# Patient Record
Sex: Female | Born: 1950 | Hispanic: Yes | Marital: Single
Health system: Southern US, Community
[De-identification: ages and names within clinical notes are randomized; demographics above are authoritative.]

## PROBLEM LIST (undated history)

## (undated) DIAGNOSIS — J910 Malignant pleural effusion: Secondary | ICD-10-CM

## (undated) DIAGNOSIS — C259 Malignant neoplasm of pancreas, unspecified: Secondary | ICD-10-CM

## (undated) DIAGNOSIS — I5020 Unspecified systolic (congestive) heart failure: Secondary | ICD-10-CM

## (undated) DIAGNOSIS — J9611 Chronic respiratory failure with hypoxia: Secondary | ICD-10-CM

## (undated) DIAGNOSIS — E43 Unspecified severe protein-calorie malnutrition: Secondary | ICD-10-CM

## (undated) DIAGNOSIS — I513 Intracardiac thrombosis, not elsewhere classified: Secondary | ICD-10-CM

## (undated) DIAGNOSIS — C801 Malignant (primary) neoplasm, unspecified: Secondary | ICD-10-CM

---

## 2021-06-26 ENCOUNTER — Emergency Department: Payer: Medicare Other

## 2021-06-26 ENCOUNTER — Inpatient Hospital Stay: Payer: Medicare Other

## 2021-06-26 ENCOUNTER — Inpatient Hospital Stay
Admission: EM | Admit: 2021-06-26 | Discharge: 2021-06-30 | DRG: 871 | Disposition: A | Discharge status: Other Acute Inpatient Hospital | Payer: Medicare Other | Attending: Internal Medicine | Admitting: Internal Medicine

## 2021-06-26 ENCOUNTER — Encounter
Admission: EM | Disposition: A | Discharge status: Other Acute Inpatient Hospital | Payer: Self-pay | Source: Home / Self Care | Attending: Internal Medicine

## 2021-06-26 ENCOUNTER — Inpatient Hospital Stay: Payer: Medicare Other | Admitting: Anesthesiology

## 2021-06-26 ENCOUNTER — Other Ambulatory Visit: Payer: Self-pay

## 2021-06-26 DIAGNOSIS — A419 Sepsis, unspecified organism: Secondary | ICD-10-CM

## 2021-06-26 DIAGNOSIS — D75838 Other thrombocytosis: Secondary | ICD-10-CM | POA: Diagnosis present

## 2021-06-26 DIAGNOSIS — Z20822 Contact with and (suspected) exposure to covid-19: Secondary | ICD-10-CM | POA: Diagnosis present

## 2021-06-26 DIAGNOSIS — E871 Hypo-osmolality and hyponatremia: Secondary | ICD-10-CM | POA: Diagnosis present

## 2021-06-26 DIAGNOSIS — C259 Malignant neoplasm of pancreas, unspecified: Secondary | ICD-10-CM | POA: Diagnosis present

## 2021-06-26 DIAGNOSIS — C249 Malignant neoplasm of biliary tract, unspecified: Secondary | ICD-10-CM | POA: Diagnosis present

## 2021-06-26 DIAGNOSIS — K819 Cholecystitis, unspecified: Secondary | ICD-10-CM

## 2021-06-26 DIAGNOSIS — Z79899 Other long term (current) drug therapy: Secondary | ICD-10-CM

## 2021-06-26 DIAGNOSIS — E8809 Other disorders of plasma-protein metabolism, not elsewhere classified: Secondary | ICD-10-CM | POA: Diagnosis present

## 2021-06-26 DIAGNOSIS — R4182 Altered mental status, unspecified: Secondary | ICD-10-CM | POA: Diagnosis present

## 2021-06-26 DIAGNOSIS — K8309 Other cholangitis: Secondary | ICD-10-CM | POA: Diagnosis present

## 2021-06-26 DIAGNOSIS — M6281 Muscle weakness (generalized): Secondary | ICD-10-CM | POA: Diagnosis present

## 2021-06-26 DIAGNOSIS — R1011 Right upper quadrant pain: Secondary | ICD-10-CM

## 2021-06-26 DIAGNOSIS — K859 Acute pancreatitis without necrosis or infection, unspecified: Secondary | ICD-10-CM

## 2021-06-26 DIAGNOSIS — I10 Essential (primary) hypertension: Secondary | ICD-10-CM | POA: Diagnosis present

## 2021-06-26 DIAGNOSIS — F32A Depression, unspecified: Secondary | ICD-10-CM | POA: Diagnosis present

## 2021-06-26 DIAGNOSIS — D72829 Elevated white blood cell count, unspecified: Secondary | ICD-10-CM | POA: Diagnosis not present

## 2021-06-26 DIAGNOSIS — E876 Hypokalemia: Secondary | ICD-10-CM | POA: Diagnosis not present

## 2021-06-26 DIAGNOSIS — E878 Other disorders of electrolyte and fluid balance, not elsewhere classified: Secondary | ICD-10-CM | POA: Diagnosis present

## 2021-06-26 DIAGNOSIS — F419 Anxiety disorder, unspecified: Secondary | ICD-10-CM | POA: Diagnosis present

## 2021-06-26 DIAGNOSIS — Z6841 Body Mass Index (BMI) 40.0 and over, adult: Secondary | ICD-10-CM | POA: Diagnosis not present

## 2021-06-26 DIAGNOSIS — K8689 Other specified diseases of pancreas: Secondary | ICD-10-CM

## 2021-06-26 DIAGNOSIS — K82A1 Gangrene of gallbladder in cholecystitis: Secondary | ICD-10-CM | POA: Diagnosis present

## 2021-06-26 DIAGNOSIS — R7989 Other specified abnormal findings of blood chemistry: Secondary | ICD-10-CM | POA: Diagnosis not present

## 2021-06-26 DIAGNOSIS — K810 Acute cholecystitis: Secondary | ICD-10-CM | POA: Diagnosis not present

## 2021-06-26 DIAGNOSIS — D6489 Other specified anemias: Secondary | ICD-10-CM | POA: Diagnosis present

## 2021-06-26 DIAGNOSIS — A4151 Sepsis due to Escherichia coli [E. coli]: Secondary | ICD-10-CM | POA: Diagnosis present

## 2021-06-26 DIAGNOSIS — Z515 Encounter for palliative care: Secondary | ICD-10-CM | POA: Diagnosis not present

## 2021-06-26 DIAGNOSIS — R6520 Severe sepsis without septic shock: Secondary | ICD-10-CM | POA: Diagnosis present

## 2021-06-26 DIAGNOSIS — K831 Obstruction of bile duct: Secondary | ICD-10-CM | POA: Diagnosis present

## 2021-06-26 DIAGNOSIS — R17 Unspecified jaundice: Secondary | ICD-10-CM

## 2021-06-26 DIAGNOSIS — Z7189 Other specified counseling: Secondary | ICD-10-CM | POA: Diagnosis not present

## 2021-06-26 HISTORY — PX: ERCP: SHX5425

## 2021-06-26 LAB — BASIC METABOLIC PANEL
Anion gap: 7 (ref 5–15)
BUN: 7 mg/dL — ABNORMAL LOW (ref 8–23)
CO2: 24 mmol/L (ref 22–32)
Calcium: 7.7 mg/dL — ABNORMAL LOW (ref 8.9–10.3)
Chloride: 99 mmol/L (ref 98–111)
Creatinine, Ser: <0.3 mg/dL — ABNORMAL LOW (ref 0.44–1.00)
Glucose, Bld: 129 mg/dL — ABNORMAL HIGH (ref 70–99)
Potassium: 3.3 mmol/L — ABNORMAL LOW (ref 3.5–5.1)
Sodium: 130 mmol/L — ABNORMAL LOW (ref 135–145)

## 2021-06-26 LAB — SALICYLATE LEVEL: Salicylate Lvl: 8.4 mg/dL (ref 7.0–30.0)

## 2021-06-26 LAB — COMPREHENSIVE METABOLIC PANEL
ALT: 47 U/L — ABNORMAL HIGH (ref 0–44)
AST: 105 U/L — ABNORMAL HIGH (ref 15–41)
Albumin: 2 g/dL — ABNORMAL LOW (ref 3.5–5.0)
Alkaline Phosphatase: 521 U/L — ABNORMAL HIGH (ref 38–126)
Anion gap: 11 (ref 5–15)
BUN: 10 mg/dL (ref 8–23)
CO2: 27 mmol/L (ref 22–32)
Calcium: 8.5 mg/dL — ABNORMAL LOW (ref 8.9–10.3)
Chloride: 91 mmol/L — ABNORMAL LOW (ref 98–111)
Creatinine, Ser: <0.3 mg/dL — ABNORMAL LOW (ref 0.44–1.00)
Glucose, Bld: 117 mg/dL — ABNORMAL HIGH (ref 70–99)
Potassium: 2.7 mmol/L — CL (ref 3.5–5.1)
Sodium: 129 mmol/L — ABNORMAL LOW (ref 135–145)
Total Bilirubin: 20.9 mg/dL (ref 0.3–1.2)
Total Protein: 7.4 g/dL (ref 6.5–8.1)

## 2021-06-26 LAB — BLOOD CULTURE ID PANEL (REFLEXED) - BCID2

## 2021-06-26 LAB — CBC WITH DIFFERENTIAL/PLATELET
Abs Immature Granulocytes: 0.28 10*3/uL — ABNORMAL HIGH (ref 0.00–0.07)
Basophils Absolute: 0.1 10*3/uL (ref 0.0–0.1)
Basophils Relative: 0 %
Eosinophils Absolute: 0 10*3/uL (ref 0.0–0.5)
Eosinophils Relative: 0 %
HCT: 34.4 % — ABNORMAL LOW (ref 36.0–46.0)
Hemoglobin: 11.5 g/dL — ABNORMAL LOW (ref 12.0–15.0)
Immature Granulocytes: 2 %
Lymphocytes Relative: 5 %
Lymphs Abs: 0.6 10*3/uL — ABNORMAL LOW (ref 0.7–4.0)
MCH: 30.7 pg (ref 26.0–34.0)
MCHC: 33.4 g/dL (ref 30.0–36.0)
MCV: 92 fL (ref 80.0–100.0)
Monocytes Absolute: 1 10*3/uL (ref 0.1–1.0)
Monocytes Relative: 7 %
Neutro Abs: 12 10*3/uL — ABNORMAL HIGH (ref 1.7–7.7)
Neutrophils Relative %: 86 %
Platelets: 439 10*3/uL — ABNORMAL HIGH (ref 150–400)
RBC: 3.74 MIL/uL — ABNORMAL LOW (ref 3.87–5.11)
RDW: 15.9 % — ABNORMAL HIGH (ref 11.5–15.5)
WBC: 14 10*3/uL — ABNORMAL HIGH (ref 4.0–10.5)
nRBC: 0 % (ref 0.0–0.2)

## 2021-06-26 LAB — RESP PANEL BY RT-PCR (FLU A&B, COVID) ARPGX2
Influenza A by PCR: NEGATIVE
Influenza B by PCR: NEGATIVE
SARS Coronavirus 2 by RT PCR: NEGATIVE

## 2021-06-26 LAB — BLOOD GAS, VENOUS
Acid-Base Excess: 7.2 mmol/L — ABNORMAL HIGH (ref 0.0–2.0)
Bicarbonate: 31.2 mmol/L — ABNORMAL HIGH (ref 20.0–28.0)
O2 Saturation: 85.5 %
Patient temperature: 37
pCO2, Ven: 41 mmHg — ABNORMAL LOW (ref 44.0–60.0)
pH, Ven: 7.49 — ABNORMAL HIGH (ref 7.250–7.430)
pO2, Ven: 46 mmHg — ABNORMAL HIGH (ref 32.0–45.0)

## 2021-06-26 LAB — ETHANOL: Alcohol, Ethyl (B): <10 mg/dL (ref <10)

## 2021-06-26 LAB — PROTIME-INR
INR: 1.3 — ABNORMAL HIGH (ref 0.8–1.2)
Prothrombin Time: 16 seconds — ABNORMAL HIGH (ref 11.4–15.2)

## 2021-06-26 LAB — ACETAMINOPHEN LEVEL: Acetaminophen (Tylenol), Serum: <10 ug/mL — ABNORMAL LOW (ref 10–30)

## 2021-06-26 LAB — APTT: aPTT: 28 seconds (ref 24–36)

## 2021-06-26 LAB — MAGNESIUM: Magnesium: 1.9 mg/dL (ref 1.7–2.4)

## 2021-06-26 LAB — LACTIC ACID, PLASMA
Lactic Acid, Venous: 1.7 mmol/L (ref 0.5–1.9)
Lactic Acid, Venous: 1.2 mmol/L (ref 0.5–1.9)
Lactic Acid, Venous: 1.1 mmol/L (ref 0.5–1.9)

## 2021-06-26 LAB — AMMONIA: Ammonia: 47 umol/L — ABNORMAL HIGH (ref 9–35)

## 2021-06-26 SURGERY — ERCP, WITH INTERVENTION IF INDICATED
Anesthesia: General

## 2021-06-26 MED ORDER — VANCOMYCIN HCL IN DEXTROSE 1-5 GM/200ML-% IV SOLN
1000.0000 mg | INTRAVENOUS | Status: DC
  Filled 2021-06-26: qty 200

## 2021-06-26 MED ORDER — ACETAMINOPHEN 325 MG PO TABS: 650.0000 mg | ORAL_TABLET | Freq: Four times a day (QID) | ORAL | Status: DC | PRN

## 2021-06-26 MED ORDER — ROCURONIUM BROMIDE 100 MG/10ML IV SOLN
INTRAVENOUS | Status: DC | PRN
  Administered 2021-06-26: 50 mg via INTRAVENOUS
  Administered 2021-06-26: 5 mg via INTRAVENOUS

## 2021-06-26 MED ORDER — SODIUM CHLORIDE 0.9 % IV BOLUS
1000.0000 mL | Freq: Once | INTRAVENOUS | Status: AC
  Administered 2021-06-26: 1000 mL via INTRAVENOUS

## 2021-06-26 MED ORDER — SODIUM CHLORIDE 0.9 % IV BOLUS (SEPSIS)
1000.0000 mL | Freq: Once | INTRAVENOUS | Status: AC
  Administered 2021-06-26: 1000 mL via INTRAVENOUS

## 2021-06-26 MED ORDER — SODIUM CHLORIDE 0.9 % IV SOLN: 2.0000 g | Freq: Once | INTRAVENOUS | Status: DC

## 2021-06-26 MED ORDER — PROPOFOL 10 MG/ML IV BOLUS
INTRAVENOUS | Status: DC | PRN
  Administered 2021-06-26: 100 mg via INTRAVENOUS

## 2021-06-26 MED ORDER — ONDANSETRON HCL 4 MG/2ML IJ SOLN
INTRAMUSCULAR | Status: AC
  Administered 2021-06-26: 4 mg via INTRAVENOUS
  Filled 2021-06-26: qty 2

## 2021-06-26 MED ORDER — SODIUM CHLORIDE 0.9 % IV SOLN
INTRAVENOUS | Status: DC | PRN
  Administered 2021-06-26: 80 mL

## 2021-06-26 MED ORDER — INDOMETHACIN 50 MG RE SUPP
100.0000 mg | Freq: Once | RECTAL | Status: AC
  Administered 2021-06-26: 100 mg via RECTAL

## 2021-06-26 MED ORDER — PIPERACILLIN-TAZOBACTAM 3.375 G IVPB 30 MIN
3.3750 g | Freq: Once | INTRAVENOUS | Status: AC
  Administered 2021-06-26: 3.375 g via INTRAVENOUS
  Filled 2021-06-26: qty 50

## 2021-06-26 MED ORDER — SODIUM CHLORIDE 0.9 % IV SOLN: INTRAVENOUS | Status: DC

## 2021-06-26 MED ORDER — LACTATED RINGERS IV SOLN: INTRAVENOUS | Status: DC

## 2021-06-26 MED ORDER — EPINEPHRINE 1 MG/10ML IJ SOSY
PREFILLED_SYRINGE | INTRAMUSCULAR | Status: DC | PRN
  Administered 2021-06-26: .08 mg

## 2021-06-26 MED ORDER — ONDANSETRON HCL 4 MG/2ML IJ SOLN: 4.0000 mg | Freq: Once | INTRAMUSCULAR | Status: AC

## 2021-06-26 MED ORDER — METRONIDAZOLE 500 MG/100ML IV SOLN
500.0000 mg | Freq: Two times a day (BID) | INTRAVENOUS | Status: DC
  Administered 2021-06-26 – 2021-06-27 (×2): 500 mg via INTRAVENOUS
  Filled 2021-06-26 (×3): qty 100

## 2021-06-26 MED ORDER — POTASSIUM CHLORIDE CRYS ER 20 MEQ PO TBCR
40.0000 meq | EXTENDED_RELEASE_TABLET | Freq: Once | ORAL | Status: AC
  Administered 2021-06-26: 40 meq via ORAL
  Filled 2021-06-26: qty 2

## 2021-06-26 MED ORDER — MAGNESIUM HYDROXIDE 400 MG/5ML PO SUSP: 30.0000 mL | Freq: Every day | ORAL | Status: DC | PRN

## 2021-06-26 MED ORDER — POTASSIUM CHLORIDE 10 MEQ/100ML IV SOLN
10.0000 meq | INTRAVENOUS | Status: AC
  Administered 2021-06-26 (×3): 10 meq via INTRAVENOUS
  Filled 2021-06-26 (×3): qty 100

## 2021-06-26 MED ORDER — IOHEXOL 300 MG/ML  SOLN
100.0000 mL | Freq: Once | INTRAMUSCULAR | Status: AC | PRN
  Administered 2021-06-26: 100 mL via INTRAVENOUS

## 2021-06-26 MED ORDER — SUCCINYLCHOLINE CHLORIDE 200 MG/10ML IV SOSY
PREFILLED_SYRINGE | INTRAVENOUS | Status: DC | PRN
  Administered 2021-06-26: 100 mg via INTRAVENOUS

## 2021-06-26 MED ORDER — ENOXAPARIN SODIUM 40 MG/0.4ML IJ SOSY: 40.0000 mg | PREFILLED_SYRINGE | INTRAMUSCULAR | Status: DC

## 2021-06-26 MED ORDER — ACETAMINOPHEN 650 MG RE SUPP: 650.0000 mg | Freq: Four times a day (QID) | RECTAL | Status: DC | PRN

## 2021-06-26 MED ORDER — SUGAMMADEX SODIUM 200 MG/2ML IV SOLN
INTRAVENOUS | Status: DC | PRN
  Administered 2021-06-26: 200 mg via INTRAVENOUS

## 2021-06-26 MED ORDER — LACTATED RINGERS IV SOLN
INTRAVENOUS | Status: DC
  Administered 2021-06-26: 1000 mL via INTRAVENOUS

## 2021-06-26 MED ORDER — DEXMEDETOMIDINE HCL IN NACL 400 MCG/100ML IV SOLN
INTRAVENOUS | Status: DC | PRN
  Administered 2021-06-26: 8 ug via INTRAVENOUS
  Administered 2021-06-26: 4 ug via INTRAVENOUS

## 2021-06-26 MED ORDER — TRAZODONE HCL 50 MG PO TABS: 25.0000 mg | ORAL_TABLET | Freq: Every evening | ORAL | Status: DC | PRN

## 2021-06-26 MED ORDER — ONDANSETRON HCL 4 MG/2ML IJ SOLN: 4.0000 mg | Freq: Four times a day (QID) | INTRAMUSCULAR | Status: DC | PRN

## 2021-06-26 MED ORDER — LIDOCAINE HCL (CARDIAC) PF 100 MG/5ML IV SOSY
PREFILLED_SYRINGE | INTRAVENOUS | Status: DC | PRN
  Administered 2021-06-26: 100 mg via INTRAVENOUS

## 2021-06-26 MED ORDER — INDOMETHACIN 50 MG RE SUPP
RECTAL | Status: DC | PRN
  Administered 2021-06-26: 100 mg via RECTAL

## 2021-06-26 MED ORDER — VANCOMYCIN HCL 1500 MG/300ML IV SOLN
1500.0000 mg | Freq: Once | INTRAVENOUS | Status: AC
  Administered 2021-06-26: 1500 mg via INTRAVENOUS
  Filled 2021-06-26: qty 300

## 2021-06-26 MED ORDER — VANCOMYCIN HCL IN DEXTROSE 1-5 GM/200ML-% IV SOLN: 1000.0000 mg | Freq: Once | INTRAVENOUS | Status: DC

## 2021-06-26 MED ORDER — SODIUM CHLORIDE 0.9 % IV SOLN
2.0000 g | Freq: Three times a day (TID) | INTRAVENOUS | Status: DC
  Administered 2021-06-27 (×2): 2 g via INTRAVENOUS
  Filled 2021-06-26 (×4): qty 2

## 2021-06-26 MED ORDER — ONDANSETRON HCL 4 MG PO TABS: 4.0000 mg | ORAL_TABLET | Freq: Four times a day (QID) | ORAL | Status: DC | PRN

## 2021-06-26 MED ORDER — POTASSIUM CHLORIDE IN NACL 40-0.9 MEQ/L-% IV SOLN
INTRAVENOUS | Status: DC
  Filled 2021-06-26 (×8): qty 1000

## 2021-06-26 MED ORDER — ONDANSETRON HCL 4 MG/2ML IJ SOLN
INTRAMUSCULAR | Status: DC | PRN
  Administered 2021-06-26: 4 mg via INTRAVENOUS

## 2021-06-26 MED ORDER — MIDODRINE HCL 5 MG PO TABS
10.0000 mg | ORAL_TABLET | Freq: Three times a day (TID) | ORAL | Status: DC
  Filled 2021-06-26 (×3): qty 2

## 2021-06-26 MED ORDER — VANCOMYCIN HCL IN DEXTROSE 1-5 GM/200ML-% IV SOLN
1000.0000 mg | Freq: Once | INTRAVENOUS | Status: AC
  Administered 2021-06-26: 1000 mg via INTRAVENOUS
  Filled 2021-06-26: qty 200

## 2021-06-26 NOTE — Consult Note (Signed)
[facility] SURGICAL CONSULTATION NOTE (initial) - cpt: 22297   HISTORY OF PRESENT ILLNESS (HPI):  70 y.o. female presented to [facility] ED today for evaluation of jaundice and AMS. Patient's daughter reported worsening confusion since Thursday of last week. She has been also complaining of worsening upper abdominal pina, nausea, emesis, and significant yellowing of her skin. No fever, chills, cough, CP, SOB. She does endorse that her urine has been orange in color as well. No history of similar in the past. She is unable to provide much additional history. Daughter not at bedside at this time. Work up in the ED revealed a leukocytosis to 14.0K, AST to 105, ALT 47, alk phos to 521, bilirubin to 20.9, normal lactic acid levels, INR 1.3. CT Abdomen/pelvis was concerning for significant cholecystitis with biliary dilation secondary to pancreatic vs biliary malignancy. RUQ [place] confirmed cholecystitis.   Surgery is consulted by emergency medicine physician Dr. 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  PAST MEDICAL HISTORY (PMH):  History reviewed. No pertinent past medical history.   PAST SURGICAL HISTORY (Mark):  Reviewed   MEDICATIONS:  Prior to Admission medications   Not on File     ALLERGIES:  Not on File   SOCIAL HISTORY:  Social History   Socioeconomic History   Marital status: Single    Spouse name: Not on file   Number of children: Not on file   Years of education: Not on file   Highest education level: Not on file  Occupational History   Not on file  Tobacco Use   Smoking status: Not on file   Smokeless tobacco: Not on file  Substance and Sexual Activity   Alcohol use: Not on file   Drug use: Not on file   Sexual activity: Not on file  Other Topics Concern   Not on file  Social History Narrative   Not on file   Social Determinants of Health   Financial  Resource Strain: Not on file  Food Insecurity: Not on file  Transportation Needs: Not on file  Physical Activity: Not on file  Stress: Not on file  Social Connections: Not on file  Intimate Partner Violence: Not on file     FAMILY HISTORY:  No family history on file.    REVIEW OF SYSTEMS:  Review of Systems  Constitutional:  Negative for chills and fever.  HENT:  Negative for congestion and sore throat.   Respiratory:  Negative for cough and shortness of breath.   Cardiovascular:  Negative for chest pain and palpitations.  Gastrointestinal:  Positive for abdominal pain, nausea and vomiting. Negative for constipation and diarrhea.  Genitourinary:  Negative for dysuria and urgency.  Skin:  Negative for itching and rash.       + Jaundice   All other systems reviewed and are negative.  VITAL SIGNS:  Temp:  [98.3 F (36.8 C)] 98.3 F (36.8 C) (11/29 0854) Pulse Rate:  [101-114] 103 (11/29 1130) Resp:  [17-23] 23 (11/29 1130) BP: (114-125)/(59-86) 114/81 (11/29 1130) SpO2:  [94 %-98 %] 95 % (11/29 1130) Weight:  [108.9 kg] 108.9 kg (11/29 0904)     Height: $Remove'4\' 9"'ZxAnPKc$  (144.8 cm) Weight: 108.9 kg BMI (Calculated): 51.92   INTAKE/OUTPUT:  No intake/output data recorded.  PHYSICAL EXAM:  Physical Exam Vitals and nursing note reviewed. Exam conducted with a chaperone present.  Constitutional:      General: She is  not in acute distress.    Appearance: Normal appearance. She is obese. She is not ill-appearing.     Comments: Patient resting comfortably, interpretor at bedside   HENT:     Head: Normocephalic and atraumatic.     Mouth/Throat:     Mouth: Mucous membranes are moist.     Pharynx: Oropharynx is clear.  Eyes:     General: Scleral icterus present.     Pupils: Pupils are equal, round, and reactive to light.  Cardiovascular:     Rate and Rhythm: Regular rhythm. Tachycardia present.     Pulses: Normal pulses.  Pulmonary:     Effort: Pulmonary effort is normal. No  respiratory distress.     Breath sounds: Normal breath sounds.  Abdominal:     General: Abdomen is protuberant. There is no distension.     Tenderness: There is abdominal tenderness in the right upper quadrant and epigastric area. There is no guarding or rebound. Negative signs include Murphy's sign.     Comments: Abdomen is protuberant consistent with obesity, tenderness to epigastric and RUQ region, non-distended, no rebound/guarding, Negative Murphy's Sign   Genitourinary:    Comments: Deferred Skin:    General: Skin is warm and dry.     Coloration: Skin is jaundiced.     Comments: Markedly jaundice   Neurological:     General: No focal deficit present.     Mental Status: She is alert. Mental status is at baseline.  Psychiatric:        Mood and Affect: Mood normal.        Behavior: Behavior normal.     Labs:  CBC Latest Ref Rng & Units 06/26/2021  WBC 4.0 - 10.5 K/uL 14.0(H)  Hemoglobin 12.0 - 15.0 g/dL 11.5(L)  Hematocrit 36.0 - 46.0 % 34.4(L)  Platelets 150 - 400 K/uL 439(H)   CMP Latest Ref Rng & Units 06/26/2021  Glucose 70 - 99 mg/dL 117(H)  BUN 8 - 23 mg/dL 10  Creatinine 0.44 - 1.00 mg/dL <0.30(L)  Sodium 135 - 145 mmol/L 129(L)  Potassium 3.5 - 5.1 mmol/L 2.7(LL)  Chloride 98 - 111 mmol/L 91(L)  CO2 22 - 32 mmol/L 27  Calcium 8.9 - 10.3 mg/dL 8.5(L)  Total Protein 6.5 - 8.1 g/dL 7.4  Total Bilirubin 0.3 - 1.2 mg/dL 20.9(HH)  Alkaline Phos 38 - 126 U/L 521(H)  AST 15 - 41 U/L 105(H)  ALT 0 - 44 U/L 47(H)     Imaging studies:   CT Abdomen/Pelvis (11/29) personally reviewed with gallbladder changes concerning for cholecystitis, significant biliary dilation, likely pancreatic mass with resulting obstruction, and radiologist report reviewed below:  IMPRESSION: Findings that are highly suspicious for acute gangrenous cholecystitis but in the setting of 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   Soft tissue about the  celiac with distortion of the portal vein associated with the site of biliary obstruction and adjacent to level of pancreatic duct obstruction. This may represent a combination of nodal disease and primary pancreatic neoplasm versus direct extension of pancreatic neoplasm. Distortion of the portal vein is best illustrated on sagittal images.   Profound biliary duct dilation of both extrahepatic and intrahepatic biliary tree.  ADDENDUM: There is also soft tissue nodularity along the superior aspect of the gallbladder neck that is best seen on coronal image 47 of series 5 but also on axial image 29 of series 2 this measures approximately 13 mm. It is unclear whether this the constellation of findings  represents biliary primary with metastasis or pancreatic primary with metastasis.   Ductal obstruction occurs at the level of the pancreas rather than at this location.   RUQ [place] (06/26/2021) personally reviewed showing cholelithiasis with gallbladder wall thickening, findings concerning for cholecystitis, and radiologist report reviewed below:  IMPRESSION: Cholelithiasis is noted with moderate gallbladder wall thickening concerning for cholecystitis.   Intrahepatic and extrahepatic biliary dilatation is noted concerning for distal common bile duct obstruction. Potentially this is due to pancreatic mass as suggested on prior CT scan.   Assessment/Plan: (ICD-10's: K81.0) 70 y.o. female with leukocytosis, LFT elevation, and hyperbilirubinemia concerning for cholecystitis in setting of pancreatic mass and resulting biliary obstruction.   - Admit to medicine service - Agree with GI consultation; patient likely needs ERCP and stenting given significant hyperbilirubinemia  - I will discuss case with interventional radiologist; she will benefit from percutaneous cholecystostomy. In this setting, she is a sub-optimal surgical candidate     - Recommend continue NPO + IVF support  - Replete K+;  monitor - Continue IV Abx (Zosyn) - Monitor abdominal examination; on-going bowel function    - Pain control prn; antiemetics prn - Monitor hyperbilirubinemia   - Further management per primary service  With the help of Spanish-English medical interpretor, all of the above findings and recommendations were discussed with the patient, and all of patient's questions were answered to her expressed satisfaction.  Thank you for the opportunity to participate in this patient's care.   -- Edison Simon, PA-C  [facility] 06/26/2021, 12:22 PM [phone] M-F: 7am - 4pm

## 2021-06-26 NOTE — ED Notes (Signed)
Was unable to access pyxis for IV potassium. Pyxis malfunctioning. Now will give potassium and zosyn.

## 2021-06-26 NOTE — Progress Notes (Signed)
Elink following code sepsis °

## 2021-06-26 NOTE — ED Notes (Signed)
Pt ambulated to toilet in room. Had BM and voided. Did not produce urine specimen. Pt back in bed.

## 2021-06-26 NOTE — ED Notes (Signed)
Pt to CT

## 2021-06-26 NOTE — Consult Note (Signed)
CODE SEPSIS - PHARMACY COMMUNICATION  **Broad Spectrum Antibiotics should be administered within 1 hour of Sepsis diagnosis**  Time Code Sepsis Called/Page Received: 1127  Antibiotics Ordered: 6237  Time of 1st antibiotic administration: 1021  Additional action taken by pharmacy: N/A  If necessary, Name of Provider/Nurse Contacted: N/A    Darnelle Bos ,PharmD Clinical Pharmacist  06/26/2021  11:33 AM

## 2021-06-26 NOTE — Anesthesia Preprocedure Evaluation (Signed)
Anesthesia Evaluation  Patient identified by MRN, date of birth, ID band Patient awake    Reviewed: Allergy & Precautions, NPO status , Patient's Chart, lab work & pertinent test results  History of Anesthesia Complications Negative for: history of anesthetic complications  Airway Mallampati: IV   Neck ROM: Full    Dental   Missing molars x4; front teeth chipped:   Pulmonary neg pulmonary ROS,    Pulmonary exam normal breath sounds clear to auscultation       Cardiovascular hypertension, Normal cardiovascular exam Rhythm:Regular Rate:Normal     Neuro/Psych PSYCHIATRIC DISORDERS Anxiety Depression negative neurological ROS     GI/Hepatic Pancreatic mass; cholelithiasis   Endo/Other  Class 3 obesity  Renal/GU negative Renal ROS     Musculoskeletal   Abdominal   Peds  Hematology negative hematology ROS (+)   Anesthesia Other Findings   Reproductive/Obstetrics                             Anesthesia Physical Anesthesia Plan  ASA: 4  Anesthesia Plan: General   Post-op Pain Management:    Induction: Intravenous  PONV Risk Score and Plan: 3 and Ondansetron, Dexamethasone and Treatment may vary due to age or medical condition  Airway Management Planned: Oral ETT  Additional Equipment:   Intra-op Plan:   Post-operative Plan: Extubation in OR  Informed Consent: I have reviewed the patients History and Physical, chart, labs and discussed the procedure including the risks, benefits and alternatives for the proposed anesthesia with the patient or authorized representative who has indicated his/her understanding and acceptance.     Dental advisory given and Interpreter used for interveiw  Plan Discussed with: CRNA  Anesthesia Plan Comments: (Patient consented for risks of anesthesia including but not limited to:  - adverse reactions to medications - damage to eyes, teeth, lips or  other oral mucosa - nerve damage due to positioning  - sore throat or hoarseness - damage to heart, brain, nerves, lungs, other parts of body or loss of life  Informed patient about role of CRNA in peri- and intra-operative care.  Patient voiced understanding.)        Anesthesia Quick Evaluation

## 2021-06-26 NOTE — ED Notes (Signed)
Gastroenterologist was just at bedside.

## 2021-06-26 NOTE — ED Notes (Signed)
Will change out potassium bag when pt returns.

## 2021-06-26 NOTE — ED Notes (Signed)
Pt still in Ct and xray.

## 2021-06-26 NOTE — ED Provider Notes (Signed)
[facility] Emergency Department Provider Note  ____________________________________________   Event Date/Time   First MD Initiated Contact with Patient 06/26/21 0848     (approximate)  I have reviewed the triage vital signs and the nursing notes.   HISTORY  Chief Complaint Altered Mental Status    HPI Rachel Montoya is a 70 y.o. female  here with nausea, vomiting, AMS. Pt history provided primarily by daughter 2/2 mild confusion. She reports that pt has had little to no PO intake since Thursday after Thanksgiving. She has been c/o RUQ pain, nausea, and vomiting. She's had associated jaundice/yellowing of skin and increasing confusion. Has been taking ANACIN (Aspirin+Caffeine) for headaches as well. No known falls. She has a h/o regular ASA use but no regular APAP use. No known h/o liver or GB disease. Pt denies any pain currently. Reports intermittent RUQ/RLQ pain with eating. No specific alleviating factors. No recent travel outside of [place]. No new medications.        History reviewed. No pertinent past medical history.  Patient Active Problem List   Diagnosis Date Noted   Obstructive jaundice 06/26/2021     Prior to Admission medications   Not on File    Allergies Patient has no known allergies.  No family history on file.  Social History    Review of Systems  Review of Systems  Constitutional:  Positive for fatigue. Negative for fever.  HENT:  Negative for congestion and sore throat.   Eyes:  Negative for visual disturbance.  Respiratory:  Negative for cough and shortness of breath.   Cardiovascular:  Negative for chest pain.  Gastrointestinal:  Positive for abdominal pain, nausea and vomiting. Negative for diarrhea.  Genitourinary:  Negative for flank pain.  Musculoskeletal:  Negative for back pain and neck pain.  Skin:  Negative for rash and wound.  Neurological:  Negative for weakness.  All other systems reviewed and are  negative.   ____________________________________________  PHYSICAL EXAM:      VITAL SIGNS: ED Triage Vitals [06/26/21 0848]  Enc Vitals Group     BP      Pulse      Resp      Temp      Temp src      SpO2      Weight      Height      Head Circumference      Peak Flow      Pain Score 0     Pain Loc      Pain Edu?      Excl. in [place]?      Physical Exam Vitals and nursing note reviewed.  Constitutional:      General: She is not in acute distress.    Appearance: She is well-developed. She is ill-appearing.  HENT:     Head: Normocephalic and atraumatic.     Mouth/Throat:     Mouth: Mucous membranes are dry.  Eyes:     Conjunctiva/sclera: Conjunctivae normal.  Cardiovascular:     Rate and Rhythm: Regular rhythm. Tachycardia present.     Heart sounds: Normal heart sounds. No murmur heard.   No friction rub.  Pulmonary:     Effort: Pulmonary effort is normal. No respiratory distress.     Breath sounds: Normal breath sounds. No wheezing or rales.  Abdominal:     General: Abdomen is flat. There is no distension.     Palpations: Abdomen is soft.     Tenderness: There  is abdominal tenderness (mild, generalized).  Musculoskeletal:     Cervical back: Neck supple.  Skin:    General: Skin is warm.     Capillary Refill: Capillary refill takes less than 2 seconds.     Coloration: Skin is jaundiced.  Neurological:     Mental Status: She is alert and oriented to person, place, and time.     Motor: No abnormal muscle tone.      ____________________________________________   LABS (all labs ordered are listed, but only abnormal results are displayed)  Labs Reviewed  COMPREHENSIVE METABOLIC PANEL - Abnormal; Notable for the following components:      Result Value   Sodium 129 (*)    Potassium 2.7 (*)    Chloride 91 (*)    Glucose, Bld 117 (*)    Creatinine, Ser <0.30 (*)    Calcium 8.5 (*)    Albumin 2.0 (*)    AST 105 (*)    ALT 47 (*)    Alkaline Phosphatase 521  (*)    Total Bilirubin 20.9 (*)    All other components within normal limits  CBC WITH DIFFERENTIAL/PLATELET - Abnormal; Notable for the following components:   WBC 14.0 (*)    RBC 3.74 (*)    Hemoglobin 11.5 (*)    HCT 34.4 (*)    RDW 15.9 (*)    Platelets 439 (*)    Neutro Abs 12.0 (*)    Lymphs Abs 0.6 (*)    Abs Immature Granulocytes 0.28 (*)    All other components within normal limits  PROTIME-INR - Abnormal; Notable for the following components:   Prothrombin Time 16.0 (*)    INR 1.3 (*)    All other components within normal limits  AMMONIA - Abnormal; Notable for the following components:   Ammonia 47 (*)    All other components within normal limits  BLOOD GAS, VENOUS - Abnormal; Notable for the following components:   pH, Ven 7.49 (*)    pCO2, Ven 41 (*)    pO2, Ven 46.0 (*)    Bicarbonate 31.2 (*)    Acid-Base Excess 7.2 (*)    All other components within normal limits  ACETAMINOPHEN LEVEL - Abnormal; Notable for the following components:   Acetaminophen (Tylenol), Serum <10 (*)    All other components within normal limits  RESP PANEL BY RT-PCR (FLU A&B, COVID) ARPGX2  CULTURE, BLOOD (ROUTINE X 2)  CULTURE, BLOOD (ROUTINE X 2)  URINE CULTURE  LACTIC ACID, PLASMA  APTT  SALICYLATE LEVEL  ETHANOL  MAGNESIUM  URINALYSIS, COMPLETE (UACMP) WITH MICROSCOPIC  BASIC METABOLIC PANEL    ____________________________________________  EKG: Sinus tachycardia, VR 111. PR 147, QRS 160, QTc 503. LBBB, no acute ST elevations or depressions. No Sgarbossa criteria. ________________________________________  RADIOLOGY All imaging, including plain films, CT scans, and ultrasounds, independently reviewed by me, and interpretations confirmed via formal radiology reads.  ED MD interpretation:   CXR: Normal  CT abdomen/pelvis: Concern for common bile duct obstruction secondary to pancreatic versus gallbladder mass with acute gangrenous cholecystitis CT head: Negative Ultrasound  abdomen: Cholelithiasis with moderate gallbladder wall thickening concerning for cholecystitis, intra and extrahepatic biliary dilatation with dilatation of the distal common bile duct  Official radiology report(s): CT HEAD WO CONTRAST (5MM)  Result Date: 06/26/2021 CLINICAL DATA:  Mental status change. EXAM: CT HEAD WITHOUT CONTRAST TECHNIQUE: Contiguous axial images were obtained from the base of the skull through the vertex without intravenous contrast. COMPARISON:  None. FINDINGS: Brain: No  evidence of acute infarction, hemorrhage, hydrocephalus, extra-axial collection or mass lesion/mass effect. Prominence of the sulci noted. There is mild diffuse low-attenuation within the subcortical and periventricular white matter compatible with chronic microvascular disease. Vascular: No hyperdense vessel or unexpected calcification. Skull: Normal. Negative for fracture or focal lesion. Sinuses/Orbits: No acute finding. Other: None IMPRESSION: 1. No acute intracranial abnormalities. 2. Chronic small vessel ischemic change and brain atrophy. Electronically Signed   By: Kerby Moors M.D.   On: 06/26/2021 10:54   CT ABDOMEN PELVIS W CONTRAST  Addendum Date: 06/26/2021   ADDENDUM REPORT: 06/26/2021 11:30 ADDENDUM: There is also soft tissue nodularity along the superior aspect of the gallbladder neck that is best seen on coronal image 47 of series 5 but also on axial image 29 of series 2 this measures approximately 13 mm. It is unclear whether this the constellation of findings represents biliary primary with metastasis or pancreatic primary with metastasis. Ductal obstruction occurs at the level of the pancreas rather than at this location. These results were called by telephone at the time of interpretation on 06/26/2021 at 11:29 am to provider Duffy Bruce , who verbally acknowledged these results. Electronically Signed   By: Zetta Bills M.D.   On: 06/26/2021 11:30   Result Date: 06/26/2021 CLINICAL  DATA:  Abdominal pain, acute nonlocalized abdominal pain in a 70 year old female. EXAM: CT ABDOMEN AND PELVIS WITH CONTRAST TECHNIQUE: Multidetector CT imaging of the abdomen and pelvis was performed using the standard protocol following bolus administration of intravenous contrast. CONTRAST:  161mL OMNIPAQUE IOHEXOL 300 MG/ML  SOLN COMPARISON:  None available FINDINGS: Lower chest: Mild septal thickening and scarring at the lung bases. Cardiac enlargement with mitral annular calcifications and signs of coronary artery calcification. Hepatobiliary: Profound biliary duct dilation with abrupt termination of the common bile duct in the region of the pancreatic head. Irregular wall thickening with interruption of gallbladder wall enhancement and areas of septation throughout the gallbladder. Nodular area on image 29 of series 2 measuring 13 mm. Marked pericholecystic stranding and associated intrahepatic biliary duct dilation. No visible suspicious lesion in the liver on venous phase. The portal vein is mildly distorted at the level of the pancreas. The portal vein does remain patent. Hepatic veins are patent. Potential fluid collection in the gallbladder fossa measuring up to 3.5 x 2.0 cm appears to communicate with the lumen of the gallbladder Pancreas: Abrupt termination of the pancreatic duct in the neck of the pancreas. Soft tissue posterior to the SMV (image 30/2) this is at the level of the splenic portal confluence and also the level of biliary duct obstruction. Small lymph nodes throughout the celiac no additional soft tissue or findings suggestive of frank adenopathy. Peripheral pancreatic duct dilation without substantial surrounding stranding. Spleen: Mild splenic enlargement. Adrenals/Urinary Tract: Adrenal glands are normal. There is symmetric renal enhancement. No hydronephrosis. No suspicious renal lesion. Stomach/Bowel: Hiatal hernia.  Colonic diverticulosis. Vascular/Lymphatic: Distortion of the portal  vein. Potential involvement of the common hepatic artery with stranding extending along the distal aspect of the celiac trunk as well. Atherosclerotic changes throughout the abdominal aorta. No adenopathy in the pelvis. Reproductive: Unremarkable by CT. Other: No ascites.  No signs of free air. Musculoskeletal: No acute bone finding. No destructive bone process. Spinal degenerative changes. 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 Soft tissue about the celiac with distortion of the portal vein associated with  the site of biliary obstruction and adjacent to level of pancreatic duct obstruction. This may represent a combination of nodal disease and primary pancreatic neoplasm versus direct extension of pancreatic neoplasm. Distortion of the portal vein is best illustrated on sagittal images. Profound biliary duct dilation of both extrahepatic and intrahepatic biliary tree. Electronically Signed: By: Zetta Bills M.D. On: 06/26/2021 11:06   DG Chest Port 1 View  Result Date: 06/26/2021 CLINICAL DATA:  Altered mental status.  Possible sepsis.  Jaundice. EXAM: PORTABLE CHEST 1 VIEW COMPARISON:  None. FINDINGS: The heart is borderline enlarged with a left ventricular configuration. Moderate tortuosity of the thoracic aorta. The lungs are clear. No infiltrates or effusions. No pulmonary lesions. The bony thorax is intact. IMPRESSION: No acute cardiopulmonary findings. Electronically Signed   By: Marijo Sanes M.D.   On: 06/26/2021 10:01   US Abdomen Limited RUQ (LIVER/GB)  Result Date: 06/26/2021 CLINICAL DATA:  Acute right upper quadrant abdominal pain. EXAM: ULTRASOUND ABDOMEN LIMITED RIGHT UPPER QUADRANT COMPARISON:  CT of same day. FINDINGS: Gallbladder: Cholelithiasis is noted with moderate gallbladder wall thickening measuring 5 mm. No sonographic Murphy's sign  is noted. No pericholecystic fluid is noted. Common bile duct: Diameter: 13 mm which is dilated and concerning for distal common bile duct obstruction. Liver: No focal lesion identified. Within normal limits in parenchymal echogenicity. Intrahepatic biliary dilatation is noted. Portal vein is patent on color Doppler imaging with normal direction of blood flow towards the liver. Other: None. IMPRESSION: Cholelithiasis is noted with moderate gallbladder wall thickening concerning for cholecystitis. Intrahepatic and extrahepatic biliary dilatation is noted concerning for distal common bile duct obstruction. Potentially this is due to pancreatic mass as suggested on prior CT scan. Electronically Signed   By: Marijo Conception M.D.   On: 06/26/2021 11:45    ____________________________________________  PROCEDURES   Procedure(s) performed (including Critical Care):  .Critical Care Performed by: Duffy Bruce, MD Authorized by: Duffy Bruce, MD   Critical care provider statement:    Critical care time (minutes):  30   Critical care time was exclusive of:  Separately billable procedures and treating other patients   Critical care was necessary to treat or prevent imminent or life-threatening deterioration of the following conditions:  Cardiac failure, circulatory failure, sepsis and respiratory failure   Critical care was time spent personally by me on the following activities:  Development of treatment plan with patient or surrogate, discussions with consultants, evaluation of patient's response to treatment, examination of patient, ordering and review of laboratory studies, ordering and review of radiographic studies, ordering and performing treatments and interventions, pulse oximetry, re-evaluation of patient's condition and review of old charts   I assumed direction of critical care for this patient from another provider in my specialty: no     Care discussed with: accepting provider at another  facility    ____________________________________________  [place] / MDM / [place] / ED COURSE  As part of my medical decision making, I reviewed the following data within the [place] notes reviewed and incorporated, Old chart reviewed, Notes from prior ED visits, and [place] Controlled Substance Database       *Rachel Montoya was evaluated in Emergency Department on 06/26/2021 for the symptoms described in the history of present illness. She was evaluated in the context of the global COVID-19 pandemic, which necessitated consideration that the patient might be at risk for infection with the SARS-CoV-2 virus that causes COVID-19. Institutional protocols and  algorithms that pertain to the evaluation of patients at risk for COVID-19 are in a state of rapid change based on information released by regulatory bodies including the CDC and federal and state organizations. These policies and algorithms were followed during the patient's care in the ED.  Some ED evaluations and interventions may be delayed as a result of limited staffing during the pandemic.*     Medical Decision Making: 70 year old female here with generalized weakness, nausea, vomiting, and jaundice.  Clinically, patient is mildly tachycardic, borderline hypotensive, with concern for sepsis.  Lab work reviewed, shows significant leukocytosis with left shift.  CMP shows hypokalemia, AST and ALT elevation, bilirubin of 20.9, concerning for likely obstructive biliary disease.  Lactic acid is normal at 1.7.  INR slightly elevated at 1.3.  CT and ultrasound obtained, reviewed as above, and is concerning for obstructing pancreatic mass with obstruction of common bile duct.  This was discussed with Dr. Dahlia Byes of surgery, Dr. Allen Norris of GI, as well as hospitalist Dr. Sidney Ace.  Initially, plan was to admit to here although on further evaluation, Dr. Dahlia Byes feels patient may need to be transferred.  However, I  called both [facility] and [facility] which are full. Clinically, I am concerned for developing cholangitis. Feel pt should undergo ERCP more urgently, and Dr. Allen Norris has graciously agreed to do this. Will plan to admit to Hospitalist after procedure, with plan to transfer to [facility] or [facility] as needed thereafter. Both will need to be recontacted after ERCP if transfer is required. Pt, daughter updated via telephone and are in agreement, understand the urgency of needing ERCP here. IV ABX, fluids given. Cultures have been sent.  ____________________________________________  FINAL CLINICAL IMPRESSION(S) / ED DIAGNOSES  Final diagnoses:  RUQ pain  Cholangitis  Pancreatic mass  Cholecystitis     MEDICATIONS GIVEN DURING THIS VISIT:  Medications  enoxaparin (LOVENOX) injection 40 mg (has no administration in time range)  0.9 % NaCl with KCl 40 mEq / L  infusion ( Intravenous New Bag/Given 06/26/21 1502)  metroNIDAZOLE (FLAGYL) IVPB 500 mg (0 mg Intravenous Stopped 06/26/21 1549)  acetaminophen (TYLENOL) tablet 650 mg (has no administration in time range)    Or  acetaminophen (TYLENOL) suppository 650 mg (has no administration in time range)  traZODone (DESYREL) tablet 25 mg (has no administration in time range)  magnesium hydroxide (MILK OF MAGNESIA) suspension 30 mL (has no administration in time range)  ondansetron (ZOFRAN) tablet 4 mg (has no administration in time range)    Or  ondansetron (ZOFRAN) injection 4 mg (has no administration in time range)  midodrine (PROAMATINE) tablet 10 mg (10 mg Oral Not Given 06/26/21 1413)  vancomycin (VANCOCIN) IVPB 1000 mg/200 mL premix (0 mg Intravenous Stopped 06/26/21 1537)    Followed by  vancomycin (VANCOREADY) IVPB 1500 mg/300 mL (1,500 mg Intravenous New Bag/Given 06/26/21 1534)  ceFEPIme (MAXIPIME) 2 g in sodium chloride 0.9 % 100 mL IVPB (has no administration in time range)  vancomycin (VANCOCIN) IVPB 1000 mg/200 mL premix (has no administration in time  range)  sodium chloride 0.9 % bolus 1,000 mL (0 mLs Intravenous Stopped 06/26/21 1029)  potassium chloride SA (KLOR-CON M) CR tablet 40 mEq (40 mEq Oral Given 06/26/21 1020)  potassium chloride 10 mEq in 100 mL IVPB (0 mEq Intravenous Stopped 06/26/21 1353)  iohexol (OMNIPAQUE) 300 MG/ML solution 100 mL (100 mLs Intravenous Contrast Given 06/26/21 1031)  piperacillin-tazobactam (ZOSYN) IVPB 3.375 g (0 g Intravenous Stopped 06/26/21 1059)  sodium chloride 0.9 %  bolus 1,000 mL (0 mLs Intravenous Stopped 06/26/21 1245)  sodium chloride 0.9 % bolus 1,000 mL (0 mLs Intravenous Stopped 06/26/21 1529)     ED Discharge Orders     None        Note:  This document was prepared using Dragon voice recognition software and may include unintentional dictation errors.   Duffy Bruce, MD 06/26/21 1606

## 2021-06-26 NOTE — Progress Notes (Addendum)
PHARMACY - PHYSICIAN COMMUNICATION CRITICAL VALUE ALERT - BLOOD CULTURE IDENTIFICATION (BCID)  BCID results: 3 of 4 bottles with E Coli, no resistance.  Pt currently on Cefepime, Vancomycin, and Flagyl.  Algorithm recommends Ceftriaxone 2 gm.  Follow up BCID result @ 0422: 4th bottle with Staph Epi, no resistance.     Name of provider contacted: Rachael Fee, NP  Changes to prescribed antibiotics required: No changes at this time.  Per NP: pt "just had biliary stent placed today so I am going to defer to day team"  Renda Rolls, PharmD, [facility] 06/26/2021 11:53 PM

## 2021-06-26 NOTE — ED Notes (Signed)
Called pharmacy to send all meds due now. ED pyxis is being fixed and will be down for next 45 min. Unable to withdraw meds from other pyxis because these meds not loaded in other ED pyxis. Pharmacy will send now.

## 2021-06-26 NOTE — ED Notes (Signed)
Pt still in CT/xray.

## 2021-06-26 NOTE — Consult Note (Signed)
PHARMACY -  BRIEF ANTIBIOTIC NOTE   Pharmacy has received consult(s) for Zosyn from an ED provider.  The patient's profile has been reviewed for ht/wt/allergies/indication/available labs.    One time order(s) placed for Zosyn 3.375 g   Further antibiotics/pharmacy consults should be ordered by admitting physician if indicated.                       Thank you, Darnelle Bos, PharmD 06/26/2021  9:56 AM

## 2021-06-26 NOTE — ED Notes (Signed)
Dr Dahlia Byes and Dr Sidney Ace at bedside with pt and daughter explaining plan of care.

## 2021-06-26 NOTE — ED Notes (Signed)
Spoke with Verna Czech RN in endoscopy. They will come for pt around 4pm.

## 2021-06-26 NOTE — ED Notes (Signed)
Daughter back at bedside. Pt resting in bed. Denies abdominal pain at this time.

## 2021-06-26 NOTE — ED Notes (Signed)
Provider at bedside.   Pt has slowed responses but is responding appropriately to questions. Pt is extremely jaundiced.

## 2021-06-26 NOTE — Anesthesia Procedure Notes (Signed)
Procedure Name: Intubation Date/Time: 06/26/2021 5:32 PM Performed by: Kerri Perches, CRNA Pre-anesthesia Checklist: Patient identified, Patient being monitored, Timeout performed, Emergency Drugs available and Suction available Patient Re-evaluated:Patient Re-evaluated prior to induction Oxygen Delivery Method: Circle system utilized Preoxygenation: Pre-oxygenation with 100% oxygen Induction Type: IV induction Ventilation: Mask ventilation without difficulty Laryngoscope Size: 3, McGraph and 4 Grade View: Grade I Tube type: Oral Tube size: 7.0 mm Number of attempts: 1 Airway Equipment and Method: Stylet Placement Confirmation: ETT inserted through vocal cords under direct vision, positive ETCO2 and breath sounds checked- equal and bilateral Secured at: 20 cm Tube secured with: Tape Dental Injury: Teeth and Oropharynx as per pre-operative assessment

## 2021-06-26 NOTE — H&P (Signed)
[facility]                                                ER consult  PATIENT NAME: Rachel Montoya    MR#:  [id number]  DATE OF BIRTH:  1951-04-11  DATE OF ADMISSION:  06/26/2021  PRIMARY CARE PHYSICIAN: Pcp, No   Patient is coming from: Home  REQUESTING/REFERRING PHYSICIAN: Duffy Bruce, MD  CHIEF COMPLAINT:   Chief Complaint  Patient presents with   Altered Mental Status    HISTORY OF PRESENT ILLNESS:  Rachel Montoya is a 70 y.o. Hispanic female with medical history significant for depression, anxiety and hypertension coming with altered mental status with confusion as well as nausea and vomiting since 11/23 with associated jaundice, chills without measured fever and associated right upper quadrant abdominal pain.  She denies any diarrhea or constipation.  Her urine has been dark without hematuria.  She has not been having much appetite for the last several days.  No chest pain or dyspnea.  She has dry cough without wheezing.  No headache or dizziness or blurred vision.  No paresthesias or focal muscle weakness.  She has been taking Anacin for headaches.  ED Course: Upon presentation to the emergency room, heart rate was 114 with otherwise normal vital signs.  23,  hypokalemia of 2.7 with hypochloremia total bili of 20.9 with ammonia level of 47 and ALT 47 with AST 105 albumin of 2 and alk phos of 521.  CBC showed leukocytosis of 14 with neutrophilia and mild anemia Tylenol level was less than 10 and salicylate 8.4.,  Influenza antigens and COVID-19 PCR came back negative.  Blood cultures were drawn.  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   Soft tissue about the celiac with  distortion of the portal vein associated with the site of biliary obstruction and adjacent to level of pancreatic duct obstruction. This may represent a combination of nodal disease and primary pancreatic neoplasm versus direct extension of pancreatic neoplasm. Distortion of the portal vein is best illustrated on sagittal images.   Profound biliary duct dilation of both extrahepatic and intrahepatic biliary tree.  Further addendum was added as follows: There is also soft tissue nodularity along the superior aspect of the gallbladder neck that is best seen on coronal image 47 of series 5 but also on axial image 29 of series 2 this measures approximately 13 mm. It is unclear whether this the constellation of findings represents biliary primary with metastasis or pancreatic primary with metastasis.   Ductal obstruction occurs at the level of the pancreas rather than at this location..  Right upper quadrant ultrasound revealed cholelithiasis with moderate gallbladder wall thickening concerning for cholecystitis with intrahepatic extrapelvic biliary dilation concerning for distal common bile duct obstruction potentially due to pancreatic mass as suggested on CT scan. PAST MEDICAL HISTORY:  Depression, anxiety and hypertension.  PAST SURGICAL HISTORY:  Sigmoidoscopy and colonoscopy  SOCIAL HISTORY:   Social History   Tobacco Use   Smoking status: Not on file   Smokeless tobacco: Not on file  Substance  Use Topics   Alcohol use: Not on file  She has no history of tobacco EtOH abuse or illicit drug use.  FAMILY HISTORY:  No family history on file.  No pertinent familial diseases.  DRUG ALLERGIES:  Not on File  REVIEW OF SYSTEMS:   ROS As per history of present illness. All pertinent systems were reviewed above. Constitutional, HEENT, cardiovascular, respiratory, GI, GU, musculoskeletal, neuro, psychiatric, endocrine, integumentary and hematologic systems were reviewed and are  otherwise negative/unremarkable except for positive findings mentioned above in the HPI.   MEDICATIONS AT HOME:   Prior to Admission medications   Not on File      VITAL SIGNS:  Blood pressure 109/71, pulse (!) 101, temperature 98.3 F (36.8 C), temperature source Oral, resp. rate (!) 28, height $RemoveBe'4\' 9"'iToUEPLWR$  (1.448 m), weight 108.9 kg, SpO2 96 %.  PHYSICAL EXAMINATION:  Physical Exam  GENERAL:  70 y.o.-year-old Hispanic female patient lying in the bed with no acute distress.  EYES: Pupils equal, round, reactive to light and accommodation.  Significant scleral icterus.. Extraocular muscles intact.  HEENT: Head atraumatic, normocephalic. Oropharynx and nasopharynx clear.  NECK:  Supple, no jugular venous distention. No thyroid enlargement, no tenderness.  LUNGS: Normal breath sounds bilaterally, no wheezing, rales,rhonchi or crepitation. No use of accessory muscles of respiration.  CARDIOVASCULAR: Regular rate and rhythm, S1, S2 normal. No murmurs, rubs, or gallops.  ABDOMEN: Soft, nondistended, with mild right upper quadrant tenderness without rebound tenderness guarding or rigidity.. Bowel sounds present. No organomegaly or mass.  EXTREMITIES: No pedal edema, cyanosis, or clubbing.  NEUROLOGIC: Cranial nerves II through XII are intact. Muscle strength 5/5 in all extremities. Sensation intact. Gait not checked.  PSYCHIATRIC: The patient is alert and oriented x 3.  Normal affect and good eye contact. SKIN: Significant diffuse jaundice with no no obvious rash, lesion, or ulcer.   LABORATORY PANEL:   CBC Recent Labs  Lab 06/26/21 0858  WBC 14.0*  HGB 11.5*  HCT 34.4*  PLT 439*   ------------------------------------------------------------------------------------------------------------------  Chemistries  Recent Labs  Lab 06/26/21 0858  NA 129*  K 2.7*  CL 91*  CO2 27  GLUCOSE 117*  BUN 10  CREATININE <0.30*  CALCIUM 8.5*  MG 1.9  AST 105*  ALT 47*  ALKPHOS 521*   BILITOT 20.9*   ------------------------------------------------------------------------------------------------------------------  Cardiac Enzymes No results for input(s): TROPONINI in the last 168 hours. ------------------------------------------------------------------------------------------------------------------  RADIOLOGY:  CT HEAD WO CONTRAST (5MM)  Result Date: 06/26/2021 CLINICAL DATA:  Mental status change. EXAM: CT HEAD WITHOUT CONTRAST TECHNIQUE: Contiguous axial images were obtained from the base of the skull through the vertex without intravenous contrast. COMPARISON:  None. FINDINGS: Brain: No evidence of acute infarction, hemorrhage, hydrocephalus, extra-axial collection or mass lesion/mass effect. Prominence of the sulci noted. There is mild diffuse low-attenuation within the subcortical and periventricular white matter compatible with chronic microvascular disease. Vascular: No hyperdense vessel or unexpected calcification. Skull: Normal. Negative for fracture or focal lesion. Sinuses/Orbits: No acute finding. Other: None IMPRESSION: 1. No acute intracranial abnormalities. 2. Chronic small vessel ischemic change and brain atrophy. Electronically Signed   By: Kerby Moors M.D.   On: 06/26/2021 10:54   CT ABDOMEN PELVIS W CONTRAST  Addendum Date: 06/26/2021   ADDENDUM REPORT: 06/26/2021 11:30 ADDENDUM: There is also soft tissue nodularity along the superior aspect of the gallbladder neck that is best seen on coronal image 47 of series 5 but also on axial image 29 of series 2 this measures approximately 13 mm.  It is unclear whether this the constellation of findings represents biliary primary with metastasis or pancreatic primary with metastasis. Ductal obstruction occurs at the level of the pancreas rather than at this location. These results were called by telephone at the time of interpretation on 06/26/2021 at 11:29 am to provider Duffy Bruce , who verbally acknowledged  these results. Electronically Signed   By: Zetta Bills M.D.   On: 06/26/2021 11:30   Result Date: 06/26/2021 CLINICAL DATA:  Abdominal pain, acute nonlocalized abdominal pain in a 70 year old female. EXAM: CT ABDOMEN AND PELVIS WITH CONTRAST TECHNIQUE: Multidetector CT imaging of the abdomen and pelvis was performed using the standard protocol following bolus administration of intravenous contrast. CONTRAST:  156mL OMNIPAQUE IOHEXOL 300 MG/ML  SOLN COMPARISON:  None available FINDINGS: Lower chest: Mild septal thickening and scarring at the lung bases. Cardiac enlargement with mitral annular calcifications and signs of coronary artery calcification. Hepatobiliary: Profound biliary duct dilation with abrupt termination of the common bile duct in the region of the pancreatic head. Irregular wall thickening with interruption of gallbladder wall enhancement and areas of septation throughout the gallbladder. Nodular area on image 29 of series 2 measuring 13 mm. Marked pericholecystic stranding and associated intrahepatic biliary duct dilation. No visible suspicious lesion in the liver on venous phase. The portal vein is mildly distorted at the level of the pancreas. The portal vein does remain patent. Hepatic veins are patent. Potential fluid collection in the gallbladder fossa measuring up to 3.5 x 2.0 cm appears to communicate with the lumen of the gallbladder Pancreas: Abrupt termination of the pancreatic duct in the neck of the pancreas. Soft tissue posterior to the SMV (image 30/2) this is at the level of the splenic portal confluence and also the level of biliary duct obstruction. Small lymph nodes throughout the celiac no additional soft tissue or findings suggestive of frank adenopathy. Peripheral pancreatic duct dilation without substantial surrounding stranding. Spleen: Mild splenic enlargement. Adrenals/Urinary Tract: Adrenal glands are normal. There is symmetric renal enhancement. No hydronephrosis. No  suspicious renal lesion. Stomach/Bowel: Hiatal hernia.  Colonic diverticulosis. Vascular/Lymphatic: Distortion of the portal vein. Potential involvement of the common hepatic artery with stranding extending along the distal aspect of the celiac trunk as well. Atherosclerotic changes throughout the abdominal aorta. No adenopathy in the pelvis. Reproductive: Unremarkable by CT. Other: No ascites.  No signs of free air. Musculoskeletal: No acute bone finding. No destructive bone process. Spinal degenerative changes. 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 Soft tissue about the celiac with distortion of the portal vein associated with the site of biliary obstruction and adjacent to level of pancreatic duct obstruction. This may represent a combination of nodal disease and primary pancreatic neoplasm versus direct extension of pancreatic neoplasm. Distortion of the portal vein is best illustrated on sagittal images. Profound biliary duct dilation of both extrahepatic and intrahepatic biliary tree. Electronically Signed: By: Zetta Bills M.D. On: 06/26/2021 11:06   DG Chest Port 1 View  Result Date: 06/26/2021 CLINICAL DATA:  Altered mental status.  Possible sepsis.  Jaundice. EXAM: PORTABLE CHEST 1 VIEW COMPARISON:  None. FINDINGS: The heart is borderline enlarged with a left ventricular configuration. Moderate tortuosity of the thoracic aorta. The lungs are clear. No infiltrates or effusions. No pulmonary lesions. The bony thorax is intact. IMPRESSION: No acute cardiopulmonary findings. Electronically Signed   By: Ricky Stabs.D.  On: 06/26/2021 10:01   US Abdomen Limited RUQ (LIVER/GB)  Result Date: 06/26/2021 CLINICAL DATA:  Acute right upper quadrant abdominal pain. EXAM: ULTRASOUND ABDOMEN LIMITED RIGHT UPPER QUADRANT COMPARISON:  CT of same day.  FINDINGS: Gallbladder: Cholelithiasis is noted with moderate gallbladder wall thickening measuring 5 mm. No sonographic Murphy's sign is noted. No pericholecystic fluid is noted. Common bile duct: Diameter: 13 mm which is dilated and concerning for distal common bile duct obstruction. Liver: No focal lesion identified. Within normal limits in parenchymal echogenicity. Intrahepatic biliary dilatation is noted. Portal vein is patent on color Doppler imaging with normal direction of blood flow towards the liver. Other: None. IMPRESSION: Cholelithiasis is noted with moderate gallbladder wall thickening concerning for cholecystitis. Intrahepatic and extrahepatic biliary dilatation is noted concerning for distal common bile duct obstruction. Potentially this is due to pancreatic mass as suggested on prior CT scan. Electronically Signed   By: Marijo Conception M.D.   On: 06/26/2021 11:45      IMPRESSION AND PLAN:  Principal Problem:   Obstructive jaundice  1.  Obstructive jaundice secondary to pancreatic/biliary mass with associated cholelithiasis and gangrenous cholecystitis.  The patient has subsequent severe sepsis as manifested by leukocytosis, tachycardia, tachypnea and intermittent hypotension resolving with fluids. - We will place her on spectrum antibiotic therapy with IV cefepime, vancomycin and Flagyl. - Pain management will be provided. - She will be continued on hydration with IV normal saline with added potassium chloride. -We will follow elevated LFTs. - We will keeping the patient n.p.o. for the possibility of ERCP here by Dr. Allen Norris should she stay and has no bed for transfer elsewhere.  2.  Hypokalemia. - Potassium will be replaced and magnesium level will be checked.  3.  Hyponatremia and hypochloremia. - The patient will be hydrated with IV normal saline as mentioned above and will follow her BMP.  4.  History of hypertension. - Her blood pressure is currently borderline so we will hold  off any antihypertensives.  5.  Depression and anxiety. -With p.o. intake or Zoloft can be resumed.   DVT prophylaxis: Lovenox.  Code Status: full code.  Family Communication:  The plan of care was discussed in details with the patient (and family). I answered all questions. The patient agreed to proceed with the above mentioned plan. Further management will depend upon hospital course. Disposition Plan: Back to previous home environment Consults called: General surgery and gastroenterology consults. All the records are reviewed and case discussed with ED provider.  Status is: Inpatient   Remains inpatient appropriate because:Ongoing diagnostic testing needed not appropriate for outpatient work up, Unsafe d/c plan, IV treatments appropriate due to intensity of illness or inability to take PO, and Inpatient level of care appropriate due to severity of illness   Dispo: The patient is from: Home              Anticipated d/c is to: Home              Patient currently is not medically stable to d/c.              Difficult to place patient: No  Thank you Dr. Ellender Hose for allowing me to participate in the care of this very pleasant lady.  We will follow the patient if she is admitted here.  TOTAL TIME TAKING CARE OF THIS PATIENT: 60 minutes.     Christel Mormon M.D on 06/26/2021 at 1:23 PM  [facility]   From 7  PM-7 AM, contact night-coverage [email or web address]  CC: Primary care physician; Pcp, No

## 2021-06-26 NOTE — ED Notes (Signed)
EDP aware of potassium 2.7 and bilirubin 20.9. See new orders.

## 2021-06-26 NOTE — ED Notes (Signed)
ED provider at bedside with interpreter. Daughter stepped away from bedside but will return.

## 2021-06-26 NOTE — Consult Note (Signed)
Pharmacy Antibiotic Note  Rachel Montoya is a 70 y.o. female admitted on 06/26/2021 with sepsis.  Pharmacy has been consulted for vancomycin and cefepime dosing.  Plan: Vancomycin 2500 mg IV loading dose, followed by 1000 mg IV q24h  Goal AUC 400-550  Est AUC: 502.5 Est Cmax: 31.4 Est Cmin: 13.6 Calculated with SCr 0.8, Vd 0.5  Cefepime 2 g IV q8h  Monitor clinical picture, renal function, and vancomycin levels at steady state F/U C&S, abx deescalation / LOT   Height: 4\' 9"  (144.8 cm) Weight: 108.9 kg (240 lb) IBW/kg (Calculated) : 38.6  Temp (24hrs), Avg:98.3 F (36.8 C), Min:98.3 F (36.8 C), Max:98.3 F (36.8 C)  Recent Labs  Lab 06/26/21 0858  WBC 14.0*  CREATININE <0.30*  LATICACIDVEN 1.7    CrCl cannot be calculated (This lab value cannot be used to calculate CrCl because it is not a number: <0.30).    Not on File  Antimicrobials this admission: 11/29 Zosyn and flagyl given in the ED 11/29 cefepime >> 11/29 vancomycin >>   Dose adjustments this admission: N/A  Microbiology results: 11/29 BCx: pending   Thank you for allowing pharmacy to be a part of this patient's care.  Darnelle Bos, PharmD 06/26/2021 2:27 PM

## 2021-06-26 NOTE — Transfer of Care (Signed)
Immediate Anesthesia Transfer of Care Note  Patient: Rachel Montoya  Procedure(s) Performed: ENDOSCOPIC RETROGRADE CHOLANGIOPANCREATOGRAPHY (ERCP)  Patient Location: PACU  Anesthesia Type:General  Level of Consciousness: drowsy  Airway & Oxygen Therapy: Patient Spontanous Breathing and Patient connected to face mask oxygen  Post-op Assessment: Report given to RN  Post vital signs: stable  Last Vitals:  Vitals Value Taken Time  BP    Temp    Pulse 88 06/26/21 1933  Resp 23 06/26/21 1933  SpO2 99 % 06/26/21 1933  Vitals shown include unvalidated device data.  Last Pain:  Vitals:   06/26/21 1644  TempSrc: Temporal  PainSc: 0-No pain      Patients Stated Pain Goal: 0 (28/20/81 3887)  Complications: No notable events documented.

## 2021-06-26 NOTE — Consult Note (Signed)
Cephas Darby, MD [address]  [place]  [place], [place] [address]  Main: [phone]  Fax: [phone] Pager: [phone]   Consultation  Referring Provider:     No ref. provider found Primary Care Physician:  Pcp, No Primary Gastroenterologist: Althia Forts        Reason for Consultation:     Jaundice, CBD obstruction  Date of Admission:  06/26/2021 Date of Consultation:  06/26/2021         HPI:   Rachel Montoya is a 70 y.o. female with no significant past medical history is admitted with worsening of jaundice that she noticed about 5 days ago.  She also had altered mental status that started on Thanksgiving.  Patient reports nausea, vomiting, chills, subjective fevers, dark urine, decreased appetite.  She also reports upper abdominal pain radiating to the back.  Patient has been taking Anacin for headaches.  In the ER, she had tachycardia, normotensive, 98% on room air, labs significant for potassium 2.7, alkaline phosphatase 521, AST 105, ALT 47, T bili 20.9.  She has mild leukocytosis WBC count 14 K, thrombocytosis, normocytic anemia hemoglobin 11.5.  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  Patient is a Spanish-speaking female.  Patient's nurse helped with answering my questions.   NSAIDs: Aspirin for headaches  Antiplts/Anticoagulants/Anti thrombotics: None  GI Procedures: Unknown  History reviewed. No pertinent past medical history.   Prior to Admission medications   Not on File    Current Facility-Administered Medications:    0.9 % NaCl with KCl 40 mEq / L  infusion, , Intravenous, Continuous, Mansy, Jan A, MD, Last Rate: 125 mL/hr at 06/26/21  1502, New Bag at 06/26/21 1502   acetaminophen (TYLENOL) tablet 650 mg, 650 mg, Oral, Q6H PRN **OR** acetaminophen (TYLENOL) suppository 650 mg, 650 mg, Rectal, Q6H PRN, Mansy, Jan A, MD   ceFEPIme (MAXIPIME) 2 g in sodium chloride 0.9 % 100 mL IVPB, 2 g, Intravenous, Q8H, Darnelle Bos, RPH   [START ON 06/27/2021] enoxaparin (LOVENOX) injection 40 mg, 40 mg, Subcutaneous, Q24H, Mansy, Jan A, MD   magnesium hydroxide (MILK OF MAGNESIA) suspension 30 mL, 30 mL, Oral, Daily PRN, Mansy, Jan A, MD   metroNIDAZOLE (FLAGYL) IVPB 500 mg, 500 mg, Intravenous, Q12H, Mansy, Jan A, MD, Last Rate: 100 mL/hr at 06/26/21 1421, 500 mg at 06/26/21 1421   midodrine (PROAMATINE) tablet 10 mg, 10 mg, Oral, TID WC, Mansy, Jan A, MD   ondansetron (ZOFRAN) tablet 4 mg, 4 mg, Oral, Q6H PRN **OR** ondansetron (ZOFRAN) injection 4 mg, 4 mg, Intravenous, Q6H PRN, Mansy, Jan A, MD   traZODone (DESYREL) tablet 25 mg, 25 mg, Oral, QHS PRN, Mansy, Jan A, MD   [START ON 06/27/2021] vancomycin (VANCOCIN) IVPB 1000 mg/200 mL premix, 1,000 mg, Intravenous, Q24H, Darnelle Bos, RPH   [COMPLETED] vancomycin (VANCOCIN) IVPB 1000 mg/200 mL premix, 1,000 mg, Intravenous, Once, Stopped at 06/26/21 1537 **FOLLOWED BY** vancomycin (VANCOREADY) IVPB 1500 mg/300 mL, 1,500 mg, Intravenous, Once, Mansy, Jan A, MD, Last Rate: 150 mL/hr at 06/26/21 1534, 1,500 mg at 06/26/21 1534 No current outpatient medications on file.  No family history on file.      Allergies as of 06/26/2021   (Not on File)  Review of Systems:    All systems reviewed and negative except where noted in HPI.   Physical Exam:  Vital signs in last 24 hours: Temp:  [98.3 F (36.8 C)] 98.3 F (36.8 C) (11/29 0854) Pulse Rate:  [101-114] 108 (11/29 1400) Resp:  [17-28] 24 (11/29 1400) BP: (98-125)/(59-86) 110/71 (11/29 1400) SpO2:  [94 %-98 %] 98 % (11/29 1400) Weight:  [108.9 kg] 108.9 kg (11/29 0904)   General:   Pleasant, cooperative in NAD Head:   Normocephalic and atraumatic. Eyes:   Deep icterus.   Conjunctiva pink. PERRLA. Ears:  Normal auditory acuity. Neck:  Supple; no masses or thyroidomegaly Lungs: Respirations even and unlabored. Lungs clear to auscultation bilaterally.   No wheezes, crackles, or rhonchi.  Heart:  Regular rate and rhythm;  Without murmur, clicks, rubs or gallops Abdomen:  Soft, nondistended, nontender. Normal bowel sounds. No appreciable masses or hepatomegaly.  No rebound or guarding.  Rectal:  Not performed. Msk:  Symmetrical without gross deformities.  Strength generalized weakness Extremities:  Without edema, cyanosis or clubbing. Neurologic:  Alert and oriented x3;  grossly normal neurologically. Skin: Jaundice, intact without significant lesions or rashes. Psych:  Alert and cooperative. Normal affect.  LAB RESULTS: CBC Latest Ref Rng & Units 06/26/2021  WBC 4.0 - 10.5 K/uL 14.0(H)  Hemoglobin 12.0 - 15.0 g/dL 11.5(L)  Hematocrit 36.0 - 46.0 % 34.4(L)  Platelets 150 - 400 K/uL 439(H)    BMET BMP Latest Ref Rng & Units 06/26/2021  Glucose 70 - 99 mg/dL 117(H)  BUN 8 - 23 mg/dL 10  Creatinine 0.44 - 1.00 mg/dL <0.30(L)  Sodium 135 - 145 mmol/L 129(L)  Potassium 3.5 - 5.1 mmol/L 2.7(LL)  Chloride 98 - 111 mmol/L 91(L)  CO2 22 - 32 mmol/L 27  Calcium 8.9 - 10.3 mg/dL 8.5(L)    LFT Hepatic Function Latest Ref Rng & Units 06/26/2021  Total Protein 6.5 - 8.1 g/dL 7.4  Albumin 3.5 - 5.0 g/dL 2.0(L)  AST 15 - 41 U/L 105(H)  ALT 0 - 44 U/L 47(H)  Alk Phosphatase 38 - 126 U/L 521(H)  Total Bilirubin 0.3 - 1.2 mg/dL 20.9(HH)     STUDIES: CT HEAD WO CONTRAST (5MM)  Result Date: 06/26/2021 CLINICAL DATA:  Mental status change. EXAM: CT HEAD WITHOUT CONTRAST TECHNIQUE: Contiguous axial images were obtained from the base of the skull through the vertex without intravenous contrast. COMPARISON:  None. FINDINGS: Brain: No evidence of acute infarction, hemorrhage, hydrocephalus, extra-axial  collection or mass lesion/mass effect. Prominence of the sulci noted. There is mild diffuse low-attenuation within the subcortical and periventricular white matter compatible with chronic microvascular disease. Vascular: No hyperdense vessel or unexpected calcification. Skull: Normal. Negative for fracture or focal lesion. Sinuses/Orbits: No acute finding. Other: None IMPRESSION: 1. No acute intracranial abnormalities. 2. Chronic small vessel ischemic change and brain atrophy. Electronically Signed   By: Kerby Moors M.D.   On: 06/26/2021 10:54   CT ABDOMEN PELVIS W CONTRAST  Addendum Date: 06/26/2021   ADDENDUM REPORT: 06/26/2021 11:30 ADDENDUM: There is also soft tissue nodularity along the superior aspect of the gallbladder neck that is best seen on coronal image 47 of series 5 but also on axial image 29 of series 2 this measures approximately 13 mm. It is unclear whether this the constellation of findings represents biliary primary with metastasis or pancreatic primary with metastasis. Ductal obstruction occurs at the level of the pancreas rather than at this location. These results were called by telephone at  the time of interpretation on 06/26/2021 at 11:29 am to provider Duffy Bruce , who verbally acknowledged these results. Electronically Signed   By: Zetta Bills M.D.   On: 06/26/2021 11:30   Result Date: 06/26/2021 CLINICAL DATA:  Abdominal pain, acute nonlocalized abdominal pain in a 70 year old female. EXAM: CT ABDOMEN AND PELVIS WITH CONTRAST TECHNIQUE: Multidetector CT imaging of the abdomen and pelvis was performed using the standard protocol following bolus administration of intravenous contrast. CONTRAST:  120m OMNIPAQUE IOHEXOL 300 MG/ML  SOLN COMPARISON:  None available FINDINGS: Lower chest: Mild septal thickening and scarring at the lung bases. Cardiac enlargement with mitral annular calcifications and signs of coronary artery calcification. Hepatobiliary: Profound biliary duct  dilation with abrupt termination of the common bile duct in the region of the pancreatic head. Irregular wall thickening with interruption of gallbladder wall enhancement and areas of septation throughout the gallbladder. Nodular area on image 29 of series 2 measuring 13 mm. Marked pericholecystic stranding and associated intrahepatic biliary duct dilation. No visible suspicious lesion in the liver on venous phase. The portal vein is mildly distorted at the level of the pancreas. The portal vein does remain patent. Hepatic veins are patent. Potential fluid collection in the gallbladder fossa measuring up to 3.5 x 2.0 cm appears to communicate with the lumen of the gallbladder Pancreas: Abrupt termination of the pancreatic duct in the neck of the pancreas. Soft tissue posterior to the SMV (image 30/2) this is at the level of the splenic portal confluence and also the level of biliary duct obstruction. Small lymph nodes throughout the celiac no additional soft tissue or findings suggestive of frank adenopathy. Peripheral pancreatic duct dilation without substantial surrounding stranding. Spleen: Mild splenic enlargement. Adrenals/Urinary Tract: Adrenal glands are normal. There is symmetric renal enhancement. No hydronephrosis. No suspicious renal lesion. Stomach/Bowel: Hiatal hernia.  Colonic diverticulosis. Vascular/Lymphatic: Distortion of the portal vein. Potential involvement of the common hepatic artery with stranding extending along the distal aspect of the celiac trunk as well. Atherosclerotic changes throughout the abdominal aorta. No adenopathy in the pelvis. Reproductive: Unremarkable by CT. Other: No ascites.  No signs of free air. Musculoskeletal: No acute bone finding. No destructive bone process. Spinal degenerative changes. 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 Soft tissue about the celiac with distortion of the portal vein associated with the site of biliary obstruction and adjacent to level of pancreatic duct obstruction. This may represent a combination of nodal disease and primary pancreatic neoplasm versus direct extension of pancreatic neoplasm. Distortion of the portal vein is best illustrated on sagittal images. Profound biliary duct dilation of both extrahepatic and intrahepatic biliary tree. Electronically Signed: By: GZetta BillsM.D. On: 06/26/2021 11:06   DG Chest Port 1 View  Result Date: 06/26/2021 CLINICAL DATA:  Altered mental status.  Possible sepsis.  Jaundice. EXAM: PORTABLE CHEST 1 VIEW COMPARISON:  None. FINDINGS: The heart is borderline enlarged with a left ventricular configuration. Moderate tortuosity of the thoracic aorta. The lungs are clear. No infiltrates or effusions. No pulmonary lesions. The bony thorax is intact. IMPRESSION: No acute cardiopulmonary findings. Electronically Signed   By: PMarijo SanesM.D.   On: 06/26/2021 10:01   [place]Abdomen Limited RUQ (LIVER/GB)  Result Date: 06/26/2021 CLINICAL DATA:  Acute right upper quadrant abdominal pain. EXAM: ULTRASOUND ABDOMEN LIMITED RIGHT UPPER QUADRANT COMPARISON:  CT of same day. FINDINGS: Gallbladder: Cholelithiasis  is noted with moderate gallbladder wall thickening measuring 5 mm. No sonographic Murphy's sign is noted. No pericholecystic fluid is noted. Common bile duct: Diameter: 13 mm which is dilated and concerning for distal common bile duct obstruction. Liver: No focal lesion identified. Within normal limits in parenchymal echogenicity. Intrahepatic biliary dilatation is noted. Portal vein is patent on color Doppler imaging with normal direction of blood flow towards the liver. Other: None. IMPRESSION: Cholelithiasis is noted with moderate gallbladder wall thickening concerning for cholecystitis. Intrahepatic and extrahepatic biliary dilatation is noted  concerning for distal common bile duct obstruction. Potentially this is due to pancreatic mass as suggested on prior CT scan. Electronically Signed   By: Marijo Conception M.D.   On: 06/26/2021 11:45      Impression / Plan:   Rachel Montoya is a 70 y.o. female with no significant past medical history is admitted with painless jaundice, imaging revealed acute gangrenous cholecystitis and pancreatic head mass with pancreatic duct and CBD obstruction, profound intrahepatic and extrahepatic biliary ductal dilation.  Obstructive jaundice: ?  Pancreatic head neoplasm Currently on broad-spectrum antibiotics After discussing with the ER physician, surgical services and admitting physician, will proceed with ERCP today.  Case reviewed with Dr. Allen Norris Plan is to be transferred to [facility] for EUS and tissue diagnosis after ERCP  Hypokalemia Potassium has been replaced Please recheck potassium, goal of K above 3 to proceed with ERCP  I have discussed alternative options, risks & benefits,  which include, but are not limited to, bleeding, infection, perforation,respiratory complication & drug reaction.  The patient agrees with this plan & written consent will be obtained.     Thank you for involving me in the care of this patient.      LOS: 0 days   Sherri Sear, MD  06/26/2021, 3:40 PM    Note: This dictation was prepared with Dragon dictation along with smaller phrase technology. Any transcriptional errors that result from this process are unintentional.

## 2021-06-26 NOTE — Consult Note (Signed)
Chief Complaint: Patient was seen in consultation today for  Chief Complaint  Patient presents with   Altered Mental Status    Referring Physician(s): Alinda Sierras, PA-C  Supervising Physician: Juliet Rude  Patient Status: [facility] - ED  History of Present Illness: Rachel Montoya is a 70 y.o. female with no significant past medical history presented to the [facility] ED with worsening confusion, jaundice, RUQ pain, nausea and vomiting. 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 Additional imaging ordered.  US abdomen RUQ (Liver/GB) 06/26/21 IMPRESSION: 1. Cholelithiasis is noted with moderate gallbladder wall thickening concerning for cholecystitis. 2. Intrahepatic and extrahepatic biliary dilatation is noted concerning for distal common bile duct obstruction. Potentially this is due to pancreatic mass as suggested on prior CT scan.   Interventional Radiology has been consulted by the surgical team and asked to evaluate this patient for a percutaneous cholecystostomy drain. Imaging and clinical information reviewed by Dr. Denna Haggard   History reviewed. No pertinent past medical history.   The histories are not reviewed yet. Please review them in the "History" navigator section and refresh this [place].  Allergies: Patient has no allergy information on record.  Medications: Prior to Admission medications   Not on File     No family history on file.  Social History   Socioeconomic History   Marital status: Single    Spouse name: Not on file   Number of children: Not on file   Years of education: Not on file   Highest education level: Not on file  Occupational History   Not on file  Tobacco Use   Smoking status: Not on file   Smokeless tobacco: Not on file  Substance and Sexual Activity   Alcohol use: Not on file   Drug  use: Not on file   Sexual activity: Not on file  Other Topics Concern   Not on file  Social History Narrative   Not on file   Social Determinants of Health   Financial Resource Strain: Not on file  Food Insecurity: Not on file  Transportation Needs: Not on file  Physical Activity: Not on file  Stress: Not on file  Social Connections: Not on file    Review of Systems: A 12 point ROS discussed and pertinent positives are indicated in the HPI above.  All other systems are negative.  Review of Systems  Constitutional:  Positive for appetite change and fatigue.  Respiratory:  Negative for cough and shortness of breath.   Gastrointestinal:  Positive for abdominal pain.  Genitourinary:  Positive for flank pain.  Skin:  Positive for color change.   Vital Signs: BP 113/80   Pulse (!) 102   Temp 98.3 F (36.8 C) (Oral)   Resp (!) 24   Ht 4\' 9"  (1.448 m)   Wt 240 lb (108.9 kg)   SpO2 98%   BMI 51.94 kg/m   Physical Exam Constitutional:      General: She is not in acute distress.    Appearance: She is obese.  Pulmonary:     Effort: Pulmonary effort is normal.  Skin:    Coloration: Skin is jaundiced.  Neurological:     Mental Status: She is alert and oriented to person, place, and time.    Imaging: CT HEAD WO CONTRAST (5MM)  Result Date: 06/26/2021 CLINICAL DATA:  Mental status change. EXAM: CT HEAD WITHOUT CONTRAST TECHNIQUE: Contiguous  axial images were obtained from the base of the skull through the vertex without intravenous contrast. COMPARISON:  None. FINDINGS: Brain: No evidence of acute infarction, hemorrhage, hydrocephalus, extra-axial collection or mass lesion/mass effect. Prominence of the sulci noted. There is mild diffuse low-attenuation within the subcortical and periventricular white matter compatible with chronic microvascular disease. Vascular: No hyperdense vessel or unexpected calcification. Skull: Normal. Negative for fracture or focal lesion.  Sinuses/Orbits: No acute finding. Other: None IMPRESSION: 1. No acute intracranial abnormalities. 2. Chronic small vessel ischemic change and brain atrophy. Electronically Signed   By: Kerby Moors M.D.   On: 06/26/2021 10:54   CT ABDOMEN PELVIS W CONTRAST  Addendum Date: 06/26/2021   ADDENDUM REPORT: 06/26/2021 11:30 ADDENDUM: There is also soft tissue nodularity along the superior aspect of the gallbladder neck that is best seen on coronal image 47 of series 5 but also on axial image 29 of series 2 this measures approximately 13 mm. It is unclear whether this the constellation of findings represents biliary primary with metastasis or pancreatic primary with metastasis. Ductal obstruction occurs at the level of the pancreas rather than at this location. These results were called by telephone at the time of interpretation on 06/26/2021 at 11:29 am to provider Duffy Bruce , who verbally acknowledged these results. Electronically Signed   By: Zetta Bills M.D.   On: 06/26/2021 11:30   Result Date: 06/26/2021 CLINICAL DATA:  Abdominal pain, acute nonlocalized abdominal pain in a 70 year old female. EXAM: CT ABDOMEN AND PELVIS WITH CONTRAST TECHNIQUE: Multidetector CT imaging of the abdomen and pelvis was performed using the standard protocol following bolus administration of intravenous contrast. CONTRAST:  161mL OMNIPAQUE IOHEXOL 300 MG/ML  SOLN COMPARISON:  None available FINDINGS: Lower chest: Mild septal thickening and scarring at the lung bases. Cardiac enlargement with mitral annular calcifications and signs of coronary artery calcification. Hepatobiliary: Profound biliary duct dilation with abrupt termination of the common bile duct in the region of the pancreatic head. Irregular wall thickening with interruption of gallbladder wall enhancement and areas of septation throughout the gallbladder. Nodular area on image 29 of series 2 measuring 13 mm. Marked pericholecystic stranding and associated  intrahepatic biliary duct dilation. No visible suspicious lesion in the liver on venous phase. The portal vein is mildly distorted at the level of the pancreas. The portal vein does remain patent. Hepatic veins are patent. Potential fluid collection in the gallbladder fossa measuring up to 3.5 x 2.0 cm appears to communicate with the lumen of the gallbladder Pancreas: Abrupt termination of the pancreatic duct in the neck of the pancreas. Soft tissue posterior to the SMV (image 30/2) this is at the level of the splenic portal confluence and also the level of biliary duct obstruction. Small lymph nodes throughout the celiac no additional soft tissue or findings suggestive of frank adenopathy. Peripheral pancreatic duct dilation without substantial surrounding stranding. Spleen: Mild splenic enlargement. Adrenals/Urinary Tract: Adrenal glands are normal. There is symmetric renal enhancement. No hydronephrosis. No suspicious renal lesion. Stomach/Bowel: Hiatal hernia.  Colonic diverticulosis. Vascular/Lymphatic: Distortion of the portal vein. Potential involvement of the common hepatic artery with stranding extending along the distal aspect of the celiac trunk as well. Atherosclerotic changes throughout the abdominal aorta. No adenopathy in the pelvis. Reproductive: Unremarkable by CT. Other: No ascites.  No signs of free air. Musculoskeletal: No acute bone finding. No destructive bone process. Spinal degenerative changes. IMPRESSION: Findings that are highly suspicious for acute gangrenous cholecystitis but in the setting of suspected  pancreatic malignancy with both pancreatic and biliary duct obstruction. Soft tissue about the celiac with distortion of the portal vein associated with the site of biliary obstruction and adjacent to level of pancreatic duct obstruction. This may represent a combination of nodal disease and primary pancreatic neoplasm versus direct extension of pancreatic neoplasm. Distortion of the  portal vein is best illustrated on sagittal images. Profound biliary duct dilation of both extrahepatic and intrahepatic biliary tree. Electronically Signed: By: Zetta Bills M.D. On: 06/26/2021 11:06   DG Chest Port 1 View  Result Date: 06/26/2021 CLINICAL DATA:  Altered mental status.  Possible sepsis.  Jaundice. EXAM: PORTABLE CHEST 1 VIEW COMPARISON:  None. FINDINGS: The heart is borderline enlarged with a left ventricular configuration. Moderate tortuosity of the thoracic aorta. The lungs are clear. No infiltrates or effusions. No pulmonary lesions. The bony thorax is intact. IMPRESSION: No acute cardiopulmonary findings. Electronically Signed   By: Marijo Sanes M.D.   On: 06/26/2021 10:01   US Abdomen Limited RUQ (LIVER/GB)  Result Date: 06/26/2021 CLINICAL DATA:  Acute right upper quadrant abdominal pain. EXAM: ULTRASOUND ABDOMEN LIMITED RIGHT UPPER QUADRANT COMPARISON:  CT of same day. FINDINGS: Gallbladder: Cholelithiasis is noted with moderate gallbladder wall thickening measuring 5 mm. No sonographic Murphy's sign is noted. No pericholecystic fluid is noted. Common bile duct: Diameter: 13 mm which is dilated and concerning for distal common bile duct obstruction. Liver: No focal lesion identified. Within normal limits in parenchymal echogenicity. Intrahepatic biliary dilatation is noted. Portal vein is patent on color Doppler imaging with normal direction of blood flow towards the liver. Other: None. IMPRESSION: Cholelithiasis is noted with moderate gallbladder wall thickening concerning for cholecystitis. Intrahepatic and extrahepatic biliary dilatation is noted concerning for distal common bile duct obstruction. Potentially this is due to pancreatic mass as suggested on prior CT scan. Electronically Signed   By: Marijo Conception M.D.   On: 06/26/2021 11:45    Labs:  CBC: Recent Labs    06/26/21 0858  WBC 14.0*  HGB 11.5*  HCT 34.4*  PLT 439*    COAGS: Recent Labs     06/26/21 0858  INR 1.3*  APTT 28    BMP: Recent Labs    06/26/21 0858  NA 129*  K 2.7*  CL 91*  CO2 27  GLUCOSE 117*  BUN 10  CALCIUM 8.5*  CREATININE <0.30*  GFRNONAA NOT CALCULATED    LIVER FUNCTION TESTS: Recent Labs    06/26/21 0858  BILITOT 20.9*  AST 105*  ALT 47*  ALKPHOS 521*  PROT 7.4  ALBUMIN 2.0*    TUMOR MARKERS: No results for input(s): AFPTM, CEA, CA199, CHROMGRNA in the last 8760 hours.  Assessment and Plan:  Obstructive jaundice secondary to pancreatic/biliary mass with cholecystitis: Rachel Montoya, 70 year old female, was assessed in the [facility] ED. She understands and speaks some english but a Warehouse manager provided additional interpretation assistance. I introduced myself and the possible role of Interventional Radiology during her hospitalization. She has been consulted by GI and has pending plans, possibly today, for an ERCP with stenting. Imaging has been reviewed by IR team and our recommendation is to await patient's clinical response to GI intervention/stenting before proceeding with percutaneous cholecystostomy drain. The patient acknowledges understanding and is in agreement with this plan. IR will continue to follow.    Thank you for this interesting consult.  I greatly enjoyed meeting Rachel Montoya and look forward to participating in their care.  A copy of  this report was sent to the requesting provider on this date.  Electronically Signed: Soyla Dryer, AGACNP-BC [phone] 06/26/2021, 2:14 PM   I spent a total of 20 Minutes   in face to face in clinical consultation, greater than 50% of which was counseling/coordinating care for possible cholecystostomy drain.

## 2021-06-26 NOTE — ED Notes (Signed)
Cathy from endoscopy called. They must confirm potassium is atleast 3 before doing ERCP. Will draw ordered BMP.

## 2021-06-26 NOTE — ED Notes (Signed)
Radiology NP at bedside.

## 2021-06-26 NOTE — Op Note (Signed)
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 Procedure:             Pre-Anesthesia Assessment:                        - Prior to the procedure, a History and Physical was                         performed, and patient medications and allergies were                         reviewed. The patient's tolerance of previous                         anesthesia was also reviewed. The risks and benefits                         of the procedure and the sedation options and risks                         were discussed with the patient. All questions were                         answered, and informed consent was obtained. Prior                         Anticoagulants: The patient has taken no previous                         anticoagulant or antiplatelet agents. ASA Grade                         Assessment: III - A patient with severe systemic                         disease. After reviewing the risks and benefits, the                         patient was deemed in satisfactory condition to                         undergo the procedure.                        After obtaining informed consent, the scope was passed  under direct vision. Throughout the procedure, the                         patient's blood pressure, pulse, and oxygen                         saturations were monitored continuously. The [employer] D single use duodenoscope was                         introduced through the mouth, and used to inject                         contrast into and used to inject contrast into the                         bile duct and ventral pancreatic duct. The ERCP was                         technically difficult and complex due to challenging                         cannulation. The patient tolerated the procedure well. Findings:      The scout film was normal. The major papilla was adjacent to a       diverticulum. The major papilla was bulging. The bile duct could not be       cannulated. A biliary pre-cut sphincterotomy was made with a       sphincterotome using ERBE electrocautery. Moderate bleeding from the       sphincterotomy stopped within 5 minutes. The bile duct was deeply       cannulated with the needle knife using a freehand technique. Contrast       was injected. I personally interpreted the bile duct images. There was       brisk flow of contrast through the ducts. Image quality was excellent.       Contrast extended to the entire biliary tree. The middle third of the       main bile duct contained segmental stenoses. A wire was passed into the       biliary tree. Biliary sphincterotomy was made with a traction (standard)       sphincterotome. There was no post-sphincterotomy bleeding. One 10 Fr by       7 cm plastic stent with a single external flap and a single internal       flap was placed into the common bile duct. Bile flowed through the       stent. The stent was in good position. There may be a second stricture       at the hilum with poor contrast flow. The ampulla was injected with Epi       due to active ozzing of blood. Impression:            - The major papilla was adjacent to a diverticulum.                        - The major papilla appeared to be bulging.                        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 The strictures                         were malignant appearing.                        - A biliary sphincterotomy was performed.                        - A biliary sphincterotomy was performed.                        - One plastic stent was placed into the common bile                         duct. Recommendation:        - Return patient to hospital ward for ongoing care.                        - Clear liquid diet.                        - Repeat ERCP in 3 months to exchange stent. Procedure Code(s):     --- Professional ---                        [id number], Endoscopic retrograde cholangiopancreatography                         (ERCP); with placement of endoscopic stent into                         biliary or pancreatic duct, including pre- and                         post-dilation and guide wire passage, when performed,                         including sphincterotomy, when performed, each stent                        [id number], Endoscopic catheterization of the biliary                         ductal system, radiological supervision and                         interpretation Diagnosis Code(s):     --- Professional ---                        D49.0, Neoplasm of unspecified behavior of digestive                         system                        K83.1, Obstruction of bile duct CPT copyright 2019 American Medical Association.  All rights reserved. The codes documented in this report are preliminary and upon coder review may  be revised to meet current compliance requirements. Lucilla Lame MD, MD 06/26/2021 8:03:25 PM This report has been signed electronically. Number of Addenda: 0 Note Initiated On: 06/26/2021 5:26 PM Estimated Blood Loss:  Estimated blood loss was minimal.      [facility]

## 2021-06-26 NOTE — ED Triage Notes (Signed)
Pt comes with c/o AMS from home. EMS reports AMS started on Thanksgiving. Pt not eating or drinking. Pt is jaundiced.   Pt states pain RL mid quad that radiates to back. Pt states 10 aspirin a day. No alcohol use.  107/60 HR-120 CBG-109 T-98.7 ETCO2-35 96% RA

## 2021-06-26 NOTE — ED Notes (Signed)
[facility] called for potential GI transfer, spoke with Caryl Pina, facesheet faxed and images [facility]

## 2021-06-26 NOTE — ED Notes (Signed)
Called CT to check on pt. She is in ultrasound. Called ultrasound. Pt on way back now.

## 2021-06-26 NOTE — ED Notes (Signed)
Reina RN aware of assigned bed 

## 2021-06-27 ENCOUNTER — Encounter: Payer: Self-pay | Admitting: Gastroenterology

## 2021-06-27 ENCOUNTER — Inpatient Hospital Stay: Payer: Medicare Other

## 2021-06-27 DIAGNOSIS — K8309 Other cholangitis: Secondary | ICD-10-CM

## 2021-06-27 DIAGNOSIS — A419 Sepsis, unspecified organism: Secondary | ICD-10-CM | POA: Diagnosis not present

## 2021-06-27 DIAGNOSIS — K810 Acute cholecystitis: Secondary | ICD-10-CM

## 2021-06-27 DIAGNOSIS — K831 Obstruction of bile duct: Secondary | ICD-10-CM | POA: Diagnosis not present

## 2021-06-27 DIAGNOSIS — R6520 Severe sepsis without septic shock: Secondary | ICD-10-CM | POA: Diagnosis not present

## 2021-06-27 DIAGNOSIS — E871 Hypo-osmolality and hyponatremia: Secondary | ICD-10-CM

## 2021-06-27 DIAGNOSIS — A4151 Sepsis due to Escherichia coli [E. coli]: Secondary | ICD-10-CM | POA: Diagnosis not present

## 2021-06-27 DIAGNOSIS — E876 Hypokalemia: Secondary | ICD-10-CM

## 2021-06-27 LAB — BLOOD CULTURE ID PANEL (REFLEXED) - BCID2

## 2021-06-27 LAB — CBC
HCT: 29.2 % — ABNORMAL LOW (ref 36.0–46.0)
Hemoglobin: 9.5 g/dL — ABNORMAL LOW (ref 12.0–15.0)
MCH: 30 pg (ref 26.0–34.0)
MCHC: 32.5 g/dL (ref 30.0–36.0)
MCV: 92.1 fL (ref 80.0–100.0)
Platelets: 371 10*3/uL (ref 150–400)
RBC: 3.17 MIL/uL — ABNORMAL LOW (ref 3.87–5.11)
RDW: 16.3 % — ABNORMAL HIGH (ref 11.5–15.5)
WBC: 13.2 10*3/uL — ABNORMAL HIGH (ref 4.0–10.5)
nRBC: 0 % (ref 0.0–0.2)

## 2021-06-27 LAB — COMPREHENSIVE METABOLIC PANEL
ALT: 37 U/L (ref 0–44)
AST: 84 U/L — ABNORMAL HIGH (ref 15–41)
Albumin: 1.6 g/dL — ABNORMAL LOW (ref 3.5–5.0)
Alkaline Phosphatase: 386 U/L — ABNORMAL HIGH (ref 38–126)
Anion gap: 3 — ABNORMAL LOW (ref 5–15)
BUN: 11 mg/dL (ref 8–23)
CO2: 25 mmol/L (ref 22–32)
Calcium: 7.7 mg/dL — ABNORMAL LOW (ref 8.9–10.3)
Chloride: 104 mmol/L (ref 98–111)
Creatinine, Ser: 0.35 mg/dL — ABNORMAL LOW (ref 0.44–1.00)
GFR, Estimated: >60 mL/min (ref >60)
Glucose, Bld: 133 mg/dL — ABNORMAL HIGH (ref 70–99)
Potassium: 3.7 mmol/L (ref 3.5–5.1)
Sodium: 132 mmol/L — ABNORMAL LOW (ref 135–145)
Total Bilirubin: 18 mg/dL — ABNORMAL HIGH (ref 0.3–1.2)
Total Protein: 6.2 g/dL — ABNORMAL LOW (ref 6.5–8.1)

## 2021-06-27 LAB — PROTIME-INR
INR: 1.5 — ABNORMAL HIGH (ref 0.8–1.2)
Prothrombin Time: 18.1 seconds — ABNORMAL HIGH (ref 11.4–15.2)

## 2021-06-27 LAB — CORTISOL-AM, BLOOD: Cortisol - AM: 17.3 ug/dL (ref 6.7–22.6)

## 2021-06-27 LAB — PROCALCITONIN: Procalcitonin: 1.29 ng/mL

## 2021-06-27 MED ORDER — GADOBUTROL 1 MMOL/ML IV SOLN
10.0000 mL | Freq: Once | INTRAVENOUS | Status: AC | PRN
  Administered 2021-06-27: 10 mL via INTRAVENOUS

## 2021-06-27 MED ORDER — VITAMIN K1 10 MG/ML IJ SOLN
5.0000 mg | Freq: Once | INTRAMUSCULAR | Status: AC
  Administered 2021-06-27: 5 mg via INTRAVENOUS
  Filled 2021-06-27: qty 0.5

## 2021-06-27 MED ORDER — VITAMIN K1 10 MG/ML IJ SOLN
10.0000 mg | Freq: Once | INTRAVENOUS | Status: AC
  Administered 2021-06-27: 10 mg via INTRAVENOUS
  Filled 2021-06-27: qty 1

## 2021-06-27 MED ORDER — PIPERACILLIN-TAZOBACTAM 3.375 G IVPB
3.3750 g | Freq: Three times a day (TID) | INTRAVENOUS | Status: DC
  Administered 2021-06-27 – 2021-06-29 (×6): 3.375 g via INTRAVENOUS
  Filled 2021-06-27 (×6): qty 50

## 2021-06-27 NOTE — Progress Notes (Signed)
Rachel Darby, MD [address]  [place]  [place], [place] [address]  Main: [phone]  Fax: [phone] Pager: [phone]   Subjective: Patient underwent ERCP yesterday with stent placement for biliary decompression.  No acute events overnight.  She has been afebrile.  She denies any abdominal pain, nausea or vomiting.  Patient had a bowel movement when I went to see her and it was brown.   Objective: Vital signs in last 24 hours: Vitals:   06/27/21 0003 06/27/21 0308 06/27/21 0828 06/27/21 1138  BP: (!) 100/51 110/64 (!) 121/53 (!) 113/57  Pulse: 66 63 62 65  Resp: 16 17 14 14   Temp: 98 F (36.7 C) 98 F (36.7 C) (!) 97.3 F (36.3 C) 97.7 F (36.5 C)  TempSrc: Oral     SpO2: 98% 97% 99% 100%  Weight:      Height:       Weight change:   Intake/Output Summary (Last 24 hours) at 06/27/2021 1207 Last data filed at 06/27/2021 0216 Gross per 24 hour  Intake 1843.11 ml  Output 10 ml  Net 1833.11 ml     Exam: Heart:: Regular rate and rhythm, S1S2 present, or without murmur or extra heart sounds Lungs: normal and clear to auscultation Abdomen: soft, nontender, normal bowel sounds   Lab Results: CBC Latest Ref Rng & Units 06/27/2021 06/26/2021  WBC 4.0 - 10.5 K/uL 13.2(H) 14.0(H)  Hemoglobin 12.0 - 15.0 g/dL 9.5(L) 11.5(L)  Hematocrit 36.0 - 46.0 % 29.2(L) 34.4(L)  Platelets 150 - 400 K/uL 371 439(H)   CMP Latest Ref Rng & Units 06/27/2021 06/26/2021 06/26/2021  Glucose 70 - 99 mg/dL 133(H) 129(H) 117(H)  BUN 8 - 23 mg/dL 11 7(L) 10  Creatinine 0.44 - 1.00 mg/dL 0.35(L) <0.30(L) <0.30(L)  Sodium 135 - 145 mmol/L 132(L) 130(L) 129(L)  Potassium 3.5 - 5.1 mmol/L 3.7 3.3(L) 2.7(LL)  Chloride 98 - 111 mmol/L 104 99 91(L)  CO2 22 - 32 mmol/L 25 24 27   Calcium 8.9 - 10.3 mg/dL 7.7(L) 7.7(L) 8.5(L)  Total Protein 6.5 - 8.1 g/dL 6.2(L) - 7.4  Total Bilirubin 0.3 - 1.2 mg/dL 18.0(H) - 20.9(HH)  Alkaline Phos 38 - 126 U/L 386(H) - 521(H)  AST 15 - 41  U/L 84(H) - 105(H)  ALT 0 - 44 U/L 37 - 47(H)    Micro Results: Recent Results (from the past 240 hour(s))  Blood Culture (routine x 2)     Status: None (Preliminary result)   Collection Time: 06/26/21  8:58 AM   Specimen: BLOOD  Result Value Ref Range Status   Specimen Description BLOOD BLOOD LEFT ARM  Final   Special Requests   Final    BOTTLES DRAWN AEROBIC AND ANAEROBIC Blood Culture adequate volume   Culture  Setup Time   Final    ANAEROBIC BOTTLE ONLY GRAM NEGATIVE RODS CRITICAL VALUE NOTED.  VALUE IS CONSISTENT WITH PREVIOUSLY REPORTED AND CALLED VALUE. AEROBIC BOTTLE ONLY GRAM POSITIVE COCCI Performed at [facility], [address] [place], [place] [address]    Culture Lonell Grandchild NEGATIVE RODS GRAM POSITIVE COCCI   Final   Report Status PENDING  Incomplete  Resp Panel by RT-PCR (Flu A&B, Covid) Nasopharyngeal Swab     Status: None   Collection Time: 06/26/21  8:58 AM   Specimen: Nasopharyngeal Swab; Nasopharyngeal(NP) swabs in vial transport medium  Result Value Ref Range Status   SARS Coronavirus 2 by RT PCR NEGATIVE NEGATIVE Final    Comment: (NOTE) SARS-CoV-2  target nucleic acids are NOT DETECTED.  The SARS-CoV-2 RNA is generally detectable in upper respiratory specimens during the acute phase of infection. The lowest concentration of SARS-CoV-2 viral copies this assay can detect is 138 copies/mL. A negative result does not preclude SARS-Cov-2 infection and should not be used as the sole basis for treatment or other patient management decisions. A negative result may occur with  improper specimen collection/handling, submission of specimen other than nasopharyngeal swab, presence of viral mutation(s) within the areas targeted by this assay, and inadequate number of viral copies(<138 copies/mL). A negative result must be combined with clinical observations, patient history, and epidemiological information. The expected result is Negative.  Fact Sheet for  Patients:  EntrepreneurPulse.com.au  Fact Sheet for Healthcare Providers:  IncredibleEmployment.be  This test is no t yet approved or cleared by the [place] FDA and  has been authorized for detection and/or diagnosis of SARS-CoV-2 by FDA under an Emergency Use Authorization (EUA). This EUA will remain  in effect (meaning this test can be used) for the duration of the COVID-19 declaration under Section 564(b)(1) of the Act, 21 U.S.C.section 360bbb-3(b)(1), unless the authorization is terminated  or revoked sooner.       Influenza A by PCR NEGATIVE NEGATIVE Final   Influenza B by PCR NEGATIVE NEGATIVE Final    Comment: (NOTE) The Xpert Xpress SARS-CoV-2/FLU/RSV plus assay is intended as an aid in the diagnosis of influenza from Nasopharyngeal swab specimens and should not be used as a sole basis for treatment. Nasal washings and aspirates are unacceptable for Xpert Xpress SARS-CoV-2/FLU/RSV testing.  Fact Sheet for Patients: EntrepreneurPulse.com.au  Fact Sheet for Healthcare Providers: IncredibleEmployment.be  This test is not yet approved or cleared by the [place] FDA and has been authorized for detection and/or diagnosis of SARS-CoV-2 by FDA under an Emergency Use Authorization (EUA). This EUA will remain in effect (meaning this test can be used) for the duration of the COVID-19 declaration under Section 564(b)(1) of the Act, 21 U.S.C. section 360bbb-3(b)(1), unless the authorization is terminated or revoked.  Performed at [facility], [address] [place], [place] [address]   Blood Culture ID Panel (Reflexed)     Status: Abnormal   Collection Time: 06/26/21  8:58 AM  Result Value Ref Range Status   Enterococcus faecalis NOT DETECTED NOT DETECTED Final   Enterococcus Faecium NOT DETECTED NOT DETECTED Final   Listeria monocytogenes NOT DETECTED NOT DETECTED Final    Staphylococcus species DETECTED (A) NOT DETECTED Final    Comment: CRITICAL RESULT CALLED TO, READ BACK BY AND VERIFIED WITH: NATHAN BELUE PHARMD 0422 06/27/21 HNM    Staphylococcus aureus (BCID) NOT DETECTED NOT DETECTED Final   Staphylococcus epidermidis DETECTED (A) NOT DETECTED Final    Comment: CRITICAL RESULT CALLED TO, READ BACK BY AND VERIFIED WITH: NATHAN BELUE PHARMD 0422 06/27/21 HNM    Staphylococcus lugdunensis NOT DETECTED NOT DETECTED Final   Streptococcus species NOT DETECTED NOT DETECTED Final   Streptococcus agalactiae NOT DETECTED NOT DETECTED Final   Streptococcus pneumoniae NOT DETECTED NOT DETECTED Final   Streptococcus pyogenes NOT DETECTED NOT DETECTED Final   A.calcoaceticus-baumannii NOT DETECTED NOT DETECTED Final   Bacteroides fragilis NOT DETECTED NOT DETECTED Final   Enterobacterales NOT DETECTED NOT DETECTED Final   Enterobacter cloacae complex NOT DETECTED NOT DETECTED Final   Escherichia coli NOT DETECTED NOT DETECTED Final   Klebsiella aerogenes NOT DETECTED NOT DETECTED Final   Klebsiella oxytoca NOT DETECTED NOT DETECTED Final  Klebsiella pneumoniae NOT DETECTED NOT DETECTED Final   Proteus species NOT DETECTED NOT DETECTED Final   Salmonella species NOT DETECTED NOT DETECTED Final   Serratia marcescens NOT DETECTED NOT DETECTED Final   Haemophilus influenzae NOT DETECTED NOT DETECTED Final   Neisseria meningitidis NOT DETECTED NOT DETECTED Final   Pseudomonas aeruginosa NOT DETECTED NOT DETECTED Final   Stenotrophomonas maltophilia NOT DETECTED NOT DETECTED Final   Candida albicans NOT DETECTED NOT DETECTED Final   Candida auris NOT DETECTED NOT DETECTED Final   Candida glabrata NOT DETECTED NOT DETECTED Final   Candida krusei NOT DETECTED NOT DETECTED Final   Candida parapsilosis NOT DETECTED NOT DETECTED Final   Candida tropicalis NOT DETECTED NOT DETECTED Final   Cryptococcus neoformans/gattii NOT DETECTED NOT DETECTED Final   Methicillin  resistance mecA/C NOT DETECTED NOT DETECTED Final    Comment: Performed at [facility], [address] [place], [place] [address]  Blood Culture (routine x 2)     Status: None (Preliminary result)   Collection Time: 06/26/21  8:59 AM   Specimen: BLOOD  Result Value Ref Range Status   Specimen Description BLOOD RIGHT ANTECUBITAL  Final   Special Requests   Final    BOTTLES DRAWN AEROBIC AND ANAEROBIC Blood Culture adequate volume   Culture  Setup Time   Final    Organism ID to follow GRAM NEGATIVE RODS IN BOTH AEROBIC AND ANAEROBIC BOTTLES CRITICAL RESULT CALLED TO, READ BACK BY AND VERIFIED WITH: BRENNAN BEERS @ 2153 ON 06/26/21.Marland KitchenMarland KitchenTKR Performed at [facility], [address] [place], [place] [address]    Culture GRAM NEGATIVE RODS  Final   Report Status PENDING  Incomplete  Blood Culture ID Panel (Reflexed)     Status: Abnormal   Collection Time: 06/26/21  8:59 AM  Result Value Ref Range Status   Enterococcus faecalis NOT DETECTED NOT DETECTED Final   Enterococcus Faecium NOT DETECTED NOT DETECTED Final   Listeria monocytogenes NOT DETECTED NOT DETECTED Final   Staphylococcus species NOT DETECTED NOT DETECTED Final   Staphylococcus aureus (BCID) NOT DETECTED NOT DETECTED Final   Staphylococcus epidermidis NOT DETECTED NOT DETECTED Final   Staphylococcus lugdunensis NOT DETECTED NOT DETECTED Final   Streptococcus species NOT DETECTED NOT DETECTED Final   Streptococcus agalactiae NOT DETECTED NOT DETECTED Final   Streptococcus pneumoniae NOT DETECTED NOT DETECTED Final   Streptococcus pyogenes NOT DETECTED NOT DETECTED Final   A.calcoaceticus-baumannii NOT DETECTED NOT DETECTED Final   Bacteroides fragilis NOT DETECTED NOT DETECTED Final   Enterobacterales DETECTED (A) NOT DETECTED Final    Comment: Enterobacterales represent a large order of gram negative bacteria, not a single organism. CRITICAL RESULT CALLED TO, READ BACK BY AND VERIFIED WITH: BRENNAN  BEERS @ 2152 ON 06/26/21.Marland KitchenMarland KitchenTKR    Enterobacter cloacae complex NOT DETECTED NOT DETECTED Final   Escherichia coli DETECTED (A) NOT DETECTED Final    Comment: CRITICAL RESULT CALLED TO, READ BACK BY AND VERIFIED WITH: BRENNAN BEERS @ 2152 ON 06/26/21.Marland KitchenMarland KitchenTKR    Klebsiella aerogenes NOT DETECTED NOT DETECTED Final   Klebsiella oxytoca NOT DETECTED NOT DETECTED Final   Klebsiella pneumoniae NOT DETECTED NOT DETECTED Final   Proteus species NOT DETECTED NOT DETECTED Final   Salmonella species NOT DETECTED NOT DETECTED Final   Serratia marcescens NOT DETECTED NOT DETECTED Final   Haemophilus influenzae NOT DETECTED NOT DETECTED Final   Neisseria meningitidis NOT DETECTED NOT DETECTED Final   Pseudomonas aeruginosa NOT DETECTED NOT DETECTED Final   Stenotrophomonas maltophilia  NOT DETECTED NOT DETECTED Final   Candida albicans NOT DETECTED NOT DETECTED Final   Candida auris NOT DETECTED NOT DETECTED Final   Candida glabrata NOT DETECTED NOT DETECTED Final   Candida krusei NOT DETECTED NOT DETECTED Final   Candida parapsilosis NOT DETECTED NOT DETECTED Final   Candida tropicalis NOT DETECTED NOT DETECTED Final   Cryptococcus neoformans/gattii NOT DETECTED NOT DETECTED Final   CTX-M ESBL NOT DETECTED NOT DETECTED Final   Carbapenem resistance IMP NOT DETECTED NOT DETECTED Final   Carbapenem resistance KPC NOT DETECTED NOT DETECTED Final   Carbapenem resistance NDM NOT DETECTED NOT DETECTED Final   Carbapenem resist OXA 48 LIKE NOT DETECTED NOT DETECTED Final   Carbapenem resistance VIM NOT DETECTED NOT DETECTED Final    Comment: Performed at [facility], [address], [place], [place] [address]   Studies/Results: CT HEAD WO CONTRAST (5MM)  Result Date: 06/26/2021 CLINICAL DATA:  Mental status change. EXAM: CT HEAD WITHOUT CONTRAST TECHNIQUE: Contiguous axial images were obtained from the base of the skull through the vertex without intravenous contrast. COMPARISON:  None.  FINDINGS: Brain: No evidence of acute infarction, hemorrhage, hydrocephalus, extra-axial collection or mass lesion/mass effect. Prominence of the sulci noted. There is mild diffuse low-attenuation within the subcortical and periventricular white matter compatible with chronic microvascular disease. Vascular: No hyperdense vessel or unexpected calcification. Skull: Normal. Negative for fracture or focal lesion. Sinuses/Orbits: No acute finding. Other: None IMPRESSION: 1. No acute intracranial abnormalities. 2. Chronic small vessel ischemic change and brain atrophy. Electronically Signed   By: Kerby Moors M.D.   On: 06/26/2021 10:54   CT ABDOMEN PELVIS W CONTRAST  Addendum Date: 06/26/2021   ADDENDUM REPORT: 06/26/2021 11:30 ADDENDUM: There is also soft tissue nodularity along the superior aspect of the gallbladder neck that is best seen on coronal image 47 of series 5 but also on axial image 29 of series 2 this measures approximately 13 mm. It is unclear whether this the constellation of findings represents biliary primary with metastasis or pancreatic primary with metastasis. Ductal obstruction occurs at the level of the pancreas rather than at this location. These results were called by telephone at the time of interpretation on 06/26/2021 at 11:29 am to provider Duffy Bruce , who verbally acknowledged these results. Electronically Signed   By: Zetta Bills M.D.   On: 06/26/2021 11:30   Result Date: 06/26/2021 CLINICAL DATA:  Abdominal pain, acute nonlocalized abdominal pain in a 70 year old female. EXAM: CT ABDOMEN AND PELVIS WITH CONTRAST TECHNIQUE: Multidetector CT imaging of the abdomen and pelvis was performed using the standard protocol following bolus administration of intravenous contrast. CONTRAST:  1107mL OMNIPAQUE IOHEXOL 300 MG/ML  SOLN COMPARISON:  None available FINDINGS: Lower chest: Mild septal thickening and scarring at the lung bases. Cardiac enlargement with mitral annular  calcifications and signs of coronary artery calcification. Hepatobiliary: Profound biliary duct dilation with abrupt termination of the common bile duct in the region of the pancreatic head. Irregular wall thickening with interruption of gallbladder wall enhancement and areas of septation throughout the gallbladder. Nodular area on image 29 of series 2 measuring 13 mm. Marked pericholecystic stranding and associated intrahepatic biliary duct dilation. No visible suspicious lesion in the liver on venous phase. The portal vein is mildly distorted at the level of the pancreas. The portal vein does remain patent. Hepatic veins are patent. Potential fluid collection in the gallbladder fossa measuring up to 3.5 x 2.0 cm appears to communicate with the lumen of  the gallbladder Pancreas: Abrupt termination of the pancreatic duct in the neck of the pancreas. Soft tissue posterior to the SMV (image 30/2) this is at the level of the splenic portal confluence and also the level of biliary duct obstruction. Small lymph nodes throughout the celiac no additional soft tissue or findings suggestive of frank adenopathy. Peripheral pancreatic duct dilation without substantial surrounding stranding. Spleen: Mild splenic enlargement. Adrenals/Urinary Tract: Adrenal glands are normal. There is symmetric renal enhancement. No hydronephrosis. No suspicious renal lesion. Stomach/Bowel: Hiatal hernia.  Colonic diverticulosis. Vascular/Lymphatic: Distortion of the portal vein. Potential involvement of the common hepatic artery with stranding extending along the distal aspect of the celiac trunk as well. Atherosclerotic changes throughout the abdominal aorta. No adenopathy in the pelvis. Reproductive: Unremarkable by CT. Other: No ascites.  No signs of free air. Musculoskeletal: No acute bone finding. No destructive bone process. Spinal degenerative changes. 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 Soft tissue about the celiac with distortion of the portal vein associated with the site of biliary obstruction and adjacent to level of pancreatic duct obstruction. This may represent a combination of nodal disease and primary pancreatic neoplasm versus direct extension of pancreatic neoplasm. Distortion of the portal vein is best illustrated on sagittal images. Profound biliary duct dilation of both extrahepatic and intrahepatic biliary tree. Electronically Signed: By: Zetta Bills M.D. On: 06/26/2021 11:06   DG Chest Port 1 View  Result Date: 06/26/2021 CLINICAL DATA:  Altered mental status.  Possible sepsis.  Jaundice. EXAM: PORTABLE CHEST 1 VIEW COMPARISON:  None. FINDINGS: The heart is borderline enlarged with a left ventricular configuration. Moderate tortuosity of the thoracic aorta. The lungs are clear. No infiltrates or effusions. No pulmonary lesions. The bony thorax is intact. IMPRESSION: No acute cardiopulmonary findings. Electronically Signed   By: Marijo Sanes M.D.   On: 06/26/2021 10:01   DG C-Arm 1-60 Min-No Report  Result Date: 06/26/2021 Fluoroscopy was utilized by the requesting physician.  No radiographic interpretation.   US Abdomen Limited RUQ (LIVER/GB)  Result Date: 06/26/2021 CLINICAL DATA:  Acute right upper quadrant abdominal pain. EXAM: ULTRASOUND ABDOMEN LIMITED RIGHT UPPER QUADRANT COMPARISON:  CT of same day. FINDINGS: Gallbladder: Cholelithiasis is noted with moderate gallbladder wall thickening measuring 5 mm. No sonographic Murphy's sign is noted. No pericholecystic fluid is noted. Common bile duct: Diameter: 13 mm which is dilated and concerning for distal common bile duct obstruction. Liver: No focal lesion identified. Within normal limits in parenchymal echogenicity. Intrahepatic biliary dilatation is noted. Portal vein is patent on color Doppler imaging with  normal direction of blood flow towards the liver. Other: None. IMPRESSION: Cholelithiasis is noted with moderate gallbladder wall thickening concerning for cholecystitis. Intrahepatic and extrahepatic biliary dilatation is noted concerning for distal common bile duct obstruction. Potentially this is due to pancreatic mass as suggested on prior CT scan. Electronically Signed   By: Marijo Conception M.D.   On: 06/26/2021 11:45   Medications: I have reviewed the patient's current medications. Prior to Admission:  No medications prior to admission.   Scheduled:  midodrine  10 mg Oral TID WC   Continuous:  0.9 % NaCl with KCl 40 mEq / L 125 mL/hr at 06/27/21 0859   phytonadione (VITAMIN K) IV     piperacillin-tazobactam (ZOSYN)  IV     QQV:[id number] **OR** acetaminophen, magnesium hydroxide, ondansetron **OR** ondansetron (ZOFRAN) IV,  traZODone Anti-infectives (From admission, onward)    Start     Dose/Rate Route Frequency Ordered Stop   06/27/21 1500  vancomycin (VANCOCIN) IVPB 1000 mg/200 mL premix  Status:  Discontinued        1,000 mg 200 mL/hr over 60 Minutes Intravenous Every 24 hours 06/26/21 1444 06/27/21 1006   06/27/21 1400  piperacillin-tazobactam (ZOSYN) IVPB 3.375 g        3.375 g 12.5 mL/hr over 240 Minutes Intravenous Every 8 hours 06/27/21 1006     06/26/21 1600  ceFEPIme (MAXIPIME) 2 g in sodium chloride 0.9 % 100 mL IVPB  Status:  Discontinued        2 g 200 mL/hr over 30 Minutes Intravenous  Once 06/26/21 1344 06/26/21 1437   06/26/21 1600  ceFEPIme (MAXIPIME) 2 g in sodium chloride 0.9 % 100 mL IVPB  Status:  Discontinued        2 g 200 mL/hr over 30 Minutes Intravenous Every 8 hours 06/26/21 1437 06/27/21 1006   06/26/21 1500  ceFEPIme (MAXIPIME) 2 g in sodium chloride 0.9 % 100 mL IVPB  Status:  Discontinued        2 g 200 mL/hr over 30 Minutes Intravenous  Once 06/26/21 1325 06/26/21 1344   06/26/21 1500  vancomycin (VANCOREADY) IVPB 1500 mg/300 mL       See  Hyperspace for full Linked Orders Report.   1,500 mg 150 mL/hr over 120 Minutes Intravenous  Once 06/26/21 1352 06/26/21 1734   06/26/21 1400  metroNIDAZOLE (FLAGYL) IVPB 500 mg  Status:  Discontinued        500 mg 100 mL/hr over 60 Minutes Intravenous Every 12 hours 06/26/21 1325 06/27/21 1006   06/26/21 1400  vancomycin (VANCOCIN) IVPB 1000 mg/200 mL premix       See Hyperspace for full Linked Orders Report.   1,000 mg 200 mL/hr over 60 Minutes Intravenous  Once 06/26/21 1352 06/26/21 1537   06/26/21 1330  vancomycin (VANCOCIN) IVPB 1000 mg/200 mL premix  Status:  Discontinued        1,000 mg 200 mL/hr over 60 Minutes Intravenous  Once 06/26/21 1325 06/26/21 1350   06/26/21 1000  piperacillin-tazobactam (ZOSYN) IVPB 3.375 g        3.375 g 100 mL/hr over 30 Minutes Intravenous  Once 06/26/21 0958 06/26/21 1059      Scheduled Meds:  midodrine  10 mg Oral TID WC   Continuous Infusions:  0.9 % NaCl with KCl 40 mEq / L 125 mL/hr at 06/27/21 0932   phytonadione (VITAMIN K) IV     piperacillin-tazobactam (ZOSYN)  IV     PRN Meds:.acetaminophen **OR** acetaminophen, magnesium hydroxide, ondansetron **OR** ondansetron (ZOFRAN) IV, traZODone   Assessment: Principal Problem:   Obstructive jaundice  70 year old female is admitted with painless obstructive jaundice secondary to pancreatic versus biliary malignancy and possible acute gangrenous cholecystitis  Plan: Obstructive jaundice S/p ERCP which revealed 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  ERCP was prolonged due to presence of periampullary diverticulum.  There was ampullary oozing at the sphincterotomy site which was controlled with cautery and epinephrine.  Patient does have another proximal biliary obstruction, MRCP is ordered LFTs are slowly improving along with leukocytosis No evidence of post ERCP  pancreatitis Advance diet as tolerated Ordered vitamin K IV 10 mg x 1 due to sphincterotomy bleeding although there is no evidence of active GI bleed.  Patient did have a  mild drop in hemoglobin which could be combination of sphincterotomy bleed and IV fluids  ?  Pancreatic versus biliary malignancy General surgery is on board Awaiting transfer to tertiary care facility MRI/MRCP is pending     LOS: 1 day   Tyreece Gelles 06/27/2021, 12:07 PM

## 2021-06-27 NOTE — Progress Notes (Addendum)
PROGRESS NOTE    Rachel Montoya  MLY:[id number] DOB: 22-Mar-1951 DOA: 06/26/2021 PCP: Pcp, No   Chief complaint: jaundice. Brief Narrative:  Rachel Montoya is a 70 y.o. Hispanic female with medical history significant for depression, anxiety and hypertension coming with altered mental status with confusion as well as nausea and vomiting since 11/23 with associated jaundice, chills and right upper quadrant abdominal pain. Patient has a bilirubin of 18.  CT abdomen/pelvis showed acute gangrenous cholecystitis, biliary duct dilatation with possible obstruction at the level of pancreas.  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 Patient had a ERCP performed 11/29 by Dr. Allen Norris, due to multiple biliary obstructions, it is uncertain whether this obstruction has been resolved.  Patient also pending for cholecystostomy.  And followed by general surgery.  Assessment & Plan:   Principal Problem:   Obstructive jaundice  Obstructive jaundice secondary to pancreatic/biliary mass. Gangrenous cholecystitis. Severe sepsis. Patient has been followed by GI, ERCP was performed yesterday.  Pending repeat MRCP to determine if obstruction is better. Patient also been seen by general surgery and interventional radiology, planning for cholecystostomy. Patient will need a tertiary care.  I personally called to [facility] and [facility] transfer lines, they are currently at full capacity, not accepting outside transferring. Also called [facility], request to place images into power share, fax over facesheet, will get back to me if patient can be accepted. Blood culture came back positive for gram negative rods, as well as Staphylococcus epidermis.  Staph epidermis probably is a contaminant.  Antibiotic changed to Zosyn. Continue IV fluids.   Hypokalemia. Hyponatremia. Potassium normalized.  Continue fluids.  Morbid obesity with BMI of 51.94.   Patient has been accepted to  [facility] with attending Dr. Jeraldine Loots.  Currently patient will be on the waiting list for a bed  DVT prophylaxis: scds Code Status: full Family Communication: daughter updated at bedside Disposition Plan:    Status is: Inpatient  Remains inpatient appropriate because: severity of diseases, iv antibiotics and         I/O last 3 completed shifts: In: 2843.1 [I.V.:542.8; IV Piggyback:2300.3] Out: 10 [Blood:10] No intake/output data recorded.     Consultants:  GI and general surgery.  Procedures: ERCP  Antimicrobials: Zosyn  Subjective: Patient still has some abdominal pain, but better controlled.  No nausea vomiting. No fever or chills. No dysuria hematuria  No short of breath or cough  Objective: Vitals:   06/27/21 0003 06/27/21 0308 06/27/21 0828 06/27/21 1138  BP: (!) 100/51 110/64 (!) 121/53 (!) 113/57  Pulse: 66 63 62 65  Resp: 16 17 14 14   Temp: 98 F (36.7 C) 98 F (36.7 C) (!) 97.3 F (36.3 C) 97.7 F (36.5 C)  TempSrc: Oral     SpO2: 98% 97% 99% 100%  Weight:      Height:        Intake/Output Summary (Last 24 hours) at 06/27/2021 1315 Last data filed at 06/27/2021 0216 Gross per 24 hour  Intake 843.11 ml  Output 10 ml  Net 833.11 ml   Filed Weights   06/26/21 0904  Weight: 108.9 kg    Examination:  General exam: Appears calm and comfortable  Respiratory system: Clear to auscultation. Respiratory effort normal. Cardiovascular system: S1 & S2 heard, RRR. No JVD, murmurs, rubs, gallops or clicks. No pedal edema. Gastrointestinal system: Abdomen is nondistended, soft and nontender. No organomegaly or masses felt. Normal bowel sounds heard. Central nervous system: Alert and oriented. No  focal neurological deficits. Extremities: Symmetric 5 x 5 power. Skin: No rashes, lesions or ulcers Psychiatry: Judgement and insight appear normal. Mood & affect appropriate.     Data Reviewed: I have personally reviewed following labs and imaging  studies  CBC: Recent Labs  Lab 06/26/21 0858 06/27/21 0215  WBC 14.0* 13.2*  NEUTROABS 12.0*  --   HGB 11.5* 9.5*  HCT 34.4* 29.2*  MCV 92.0 92.1  PLT 439* 503   Basic Metabolic Panel: Recent Labs  Lab 06/26/21 0858 06/26/21 1550 06/27/21 0215  NA 129* 130* 132*  K 2.7* 3.3* 3.7  CL 91* 99 104  CO2 27 24 25   GLUCOSE 117* 129* 133*  BUN 10 7* 11  CREATININE <0.30* <0.30* 0.35*  CALCIUM 8.5* 7.7* 7.7*  MG 1.9  --   --    GFR: Estimated Creatinine Clearance: 66.9 mL/min (A) (by C-G formula based on SCr of 0.35 mg/dL (L)). Liver Function Tests: Recent Labs  Lab 06/26/21 0858 06/27/21 0215  AST 105* 84*  ALT 47* 37  ALKPHOS 521* 386*  BILITOT 20.9* 18.0*  PROT 7.4 6.2*  ALBUMIN 2.0* 1.6*   No results for input(s): LIPASE, AMYLASE in the last 168 hours. Recent Labs  Lab 06/26/21 0858  AMMONIA 47*   Coagulation Profile: Recent Labs  Lab 06/26/21 0858 06/27/21 0215  INR 1.3* 1.5*   Cardiac Enzymes: No results for input(s): CKTOTAL, CKMB, CKMBINDEX, TROPONINI in the last 168 hours. BNP (last 3 results) No results for input(s): PROBNP in the last 8760 hours. HbA1C: No results for input(s): HGBA1C in the last 72 hours. CBG: No results for input(s): GLUCAP in the last 168 hours. Lipid Profile: No results for input(s): CHOL, HDL, LDLCALC, TRIG, CHOLHDL, LDLDIRECT in the last 72 hours. Thyroid Function Tests: No results for input(s): TSH, T4TOTAL, FREET4, T3FREE, THYROIDAB in the last 72 hours. Anemia Panel: No results for input(s): VITAMINB12, FOLATE, FERRITIN, TIBC, IRON, RETICCTPCT in the last 72 hours. Sepsis Labs: Recent Labs  Lab 06/26/21 0858 06/26/21 2118 06/26/21 2253 06/27/21 0215  PROCALCITON  --   --   --  1.29  LATICACIDVEN 1.7 1.2 1.1  --     Recent Results (from the past 240 hour(s))  Blood Culture (routine x 2)     Status: None (Preliminary result)   Collection Time: 06/26/21  8:58 AM   Specimen: BLOOD  Result Value Ref Range  Status   Specimen Description BLOOD BLOOD LEFT ARM  Final   Special Requests   Final    BOTTLES DRAWN AEROBIC AND ANAEROBIC Blood Culture adequate volume   Culture  Setup Time   Final    ANAEROBIC BOTTLE ONLY GRAM NEGATIVE RODS CRITICAL VALUE NOTED.  VALUE IS CONSISTENT WITH PREVIOUSLY REPORTED AND CALLED VALUE. AEROBIC BOTTLE ONLY GRAM POSITIVE COCCI Performed at [facility], [address] [place], [place] [address]    Culture Lonell Grandchild NEGATIVE RODS GRAM POSITIVE COCCI   Final   Report Status PENDING  Incomplete  Resp Panel by RT-PCR (Flu A&B, Covid) Nasopharyngeal Swab     Status: None   Collection Time: 06/26/21  8:58 AM   Specimen: Nasopharyngeal Swab; Nasopharyngeal(NP) swabs in vial transport medium  Result Value Ref Range Status   SARS Coronavirus 2 by RT PCR NEGATIVE NEGATIVE Final    Comment: (NOTE) SARS-CoV-2 target nucleic acids are NOT DETECTED.  The SARS-CoV-2 RNA is generally detectable in upper respiratory specimens during the acute phase of infection. The lowest concentration of SARS-CoV-2 viral copies  this assay can detect is 138 copies/mL. A negative result does not preclude SARS-Cov-2 infection and should not be used as the sole basis for treatment or other patient management decisions. A negative result may occur with  improper specimen collection/handling, submission of specimen other than nasopharyngeal swab, presence of viral mutation(s) within the areas targeted by this assay, and inadequate number of viral copies(<138 copies/mL). A negative result must be combined with clinical observations, patient history, and epidemiological information. The expected result is Negative.  Fact Sheet for Patients:  EntrepreneurPulse.com.au  Fact Sheet for Healthcare Providers:  IncredibleEmployment.be  This test is no t yet approved or cleared by the [place] FDA and  has been authorized for detection and/or  diagnosis of SARS-CoV-2 by FDA under an Emergency Use Authorization (EUA). This EUA will remain  in effect (meaning this test can be used) for the duration of the COVID-19 declaration under Section 564(b)(1) of the Act, 21 U.S.C.section 360bbb-3(b)(1), unless the authorization is terminated  or revoked sooner.       Influenza A by PCR NEGATIVE NEGATIVE Final   Influenza B by PCR NEGATIVE NEGATIVE Final    Comment: (NOTE) The Xpert Xpress SARS-CoV-2/FLU/RSV plus assay is intended as an aid in the diagnosis of influenza from Nasopharyngeal swab specimens and should not be used as a sole basis for treatment. Nasal washings and aspirates are unacceptable for Xpert Xpress SARS-CoV-2/FLU/RSV testing.  Fact Sheet for Patients: EntrepreneurPulse.com.au  Fact Sheet for Healthcare Providers: IncredibleEmployment.be  This test is not yet approved or cleared by the [place] FDA and has been authorized for detection and/or diagnosis of SARS-CoV-2 by FDA under an Emergency Use Authorization (EUA). This EUA will remain in effect (meaning this test can be used) for the duration of the COVID-19 declaration under Section 564(b)(1) of the Act, 21 U.S.C. section 360bbb-3(b)(1), unless the authorization is terminated or revoked.  Performed at [facility], [address] [place], [place] [address]   Blood Culture ID Panel (Reflexed)     Status: Abnormal   Collection Time: 06/26/21  8:58 AM  Result Value Ref Range Status   Enterococcus faecalis NOT DETECTED NOT DETECTED Final   Enterococcus Faecium NOT DETECTED NOT DETECTED Final   Listeria monocytogenes NOT DETECTED NOT DETECTED Final   Staphylococcus species DETECTED (A) NOT DETECTED Final    Comment: CRITICAL RESULT CALLED TO, READ BACK BY AND VERIFIED WITH: NATHAN BELUE PHARMD 0422 06/27/21 HNM    Staphylococcus aureus (BCID) NOT DETECTED NOT DETECTED Final   Staphylococcus epidermidis  DETECTED (A) NOT DETECTED Final    Comment: CRITICAL RESULT CALLED TO, READ BACK BY AND VERIFIED WITH: NATHAN BELUE PHARMD 0422 06/27/21 HNM    Staphylococcus lugdunensis NOT DETECTED NOT DETECTED Final   Streptococcus species NOT DETECTED NOT DETECTED Final   Streptococcus agalactiae NOT DETECTED NOT DETECTED Final   Streptococcus pneumoniae NOT DETECTED NOT DETECTED Final   Streptococcus pyogenes NOT DETECTED NOT DETECTED Final   A.calcoaceticus-baumannii NOT DETECTED NOT DETECTED Final   Bacteroides fragilis NOT DETECTED NOT DETECTED Final   Enterobacterales NOT DETECTED NOT DETECTED Final   Enterobacter cloacae complex NOT DETECTED NOT DETECTED Final   Escherichia coli NOT DETECTED NOT DETECTED Final   Klebsiella aerogenes NOT DETECTED NOT DETECTED Final   Klebsiella oxytoca NOT DETECTED NOT DETECTED Final   Klebsiella pneumoniae NOT DETECTED NOT DETECTED Final   Proteus species NOT DETECTED NOT DETECTED Final   Salmonella species NOT DETECTED NOT DETECTED Final   Serratia marcescens NOT  DETECTED NOT DETECTED Final   Haemophilus influenzae NOT DETECTED NOT DETECTED Final   Neisseria meningitidis NOT DETECTED NOT DETECTED Final   Pseudomonas aeruginosa NOT DETECTED NOT DETECTED Final   Stenotrophomonas maltophilia NOT DETECTED NOT DETECTED Final   Candida albicans NOT DETECTED NOT DETECTED Final   Candida auris NOT DETECTED NOT DETECTED Final   Candida glabrata NOT DETECTED NOT DETECTED Final   Candida krusei NOT DETECTED NOT DETECTED Final   Candida parapsilosis NOT DETECTED NOT DETECTED Final   Candida tropicalis NOT DETECTED NOT DETECTED Final   Cryptococcus neoformans/gattii NOT DETECTED NOT DETECTED Final   Methicillin resistance mecA/C NOT DETECTED NOT DETECTED Final    Comment: Performed at [facility], [address] [place], [place] [address]  Blood Culture (routine x 2)     Status: None (Preliminary result)   Collection Time: 06/26/21  8:59 AM   Specimen:  BLOOD  Result Value Ref Range Status   Specimen Description BLOOD RIGHT ANTECUBITAL  Final   Special Requests   Final    BOTTLES DRAWN AEROBIC AND ANAEROBIC Blood Culture adequate volume   Culture  Setup Time   Final    Organism ID to follow GRAM NEGATIVE RODS IN BOTH AEROBIC AND ANAEROBIC BOTTLES CRITICAL RESULT CALLED TO, READ BACK BY AND VERIFIED WITH: BRENNAN BEERS @ 2153 ON 06/26/21.Marland KitchenMarland KitchenTKR Performed at [facility], [address] [place], [place] [address]    Culture GRAM NEGATIVE RODS  Final   Report Status PENDING  Incomplete  Blood Culture ID Panel (Reflexed)     Status: Abnormal   Collection Time: 06/26/21  8:59 AM  Result Value Ref Range Status   Enterococcus faecalis NOT DETECTED NOT DETECTED Final   Enterococcus Faecium NOT DETECTED NOT DETECTED Final   Listeria monocytogenes NOT DETECTED NOT DETECTED Final   Staphylococcus species NOT DETECTED NOT DETECTED Final   Staphylococcus aureus (BCID) NOT DETECTED NOT DETECTED Final   Staphylococcus epidermidis NOT DETECTED NOT DETECTED Final   Staphylococcus lugdunensis NOT DETECTED NOT DETECTED Final   Streptococcus species NOT DETECTED NOT DETECTED Final   Streptococcus agalactiae NOT DETECTED NOT DETECTED Final   Streptococcus pneumoniae NOT DETECTED NOT DETECTED Final   Streptococcus pyogenes NOT DETECTED NOT DETECTED Final   A.calcoaceticus-baumannii NOT DETECTED NOT DETECTED Final   Bacteroides fragilis NOT DETECTED NOT DETECTED Final   Enterobacterales DETECTED (A) NOT DETECTED Final    Comment: Enterobacterales represent a large order of gram negative bacteria, not a single organism. CRITICAL RESULT CALLED TO, READ BACK BY AND VERIFIED WITH: BRENNAN BEERS @ 2152 ON 06/26/21.Marland KitchenMarland KitchenTKR    Enterobacter cloacae complex NOT DETECTED NOT DETECTED Final   Escherichia coli DETECTED (A) NOT DETECTED Final    Comment: CRITICAL RESULT CALLED TO, READ BACK BY AND VERIFIED WITH: BRENNAN BEERS @ 2152 ON 06/26/21.Marland KitchenMarland KitchenTKR     Klebsiella aerogenes NOT DETECTED NOT DETECTED Final   Klebsiella oxytoca NOT DETECTED NOT DETECTED Final   Klebsiella pneumoniae NOT DETECTED NOT DETECTED Final   Proteus species NOT DETECTED NOT DETECTED Final   Salmonella species NOT DETECTED NOT DETECTED Final   Serratia marcescens NOT DETECTED NOT DETECTED Final   Haemophilus influenzae NOT DETECTED NOT DETECTED Final   Neisseria meningitidis NOT DETECTED NOT DETECTED Final   Pseudomonas aeruginosa NOT DETECTED NOT DETECTED Final   Stenotrophomonas maltophilia NOT DETECTED NOT DETECTED Final   Candida albicans NOT DETECTED NOT DETECTED Final   Candida auris NOT DETECTED NOT DETECTED Final   Candida glabrata NOT DETECTED NOT  DETECTED Final   Candida krusei NOT DETECTED NOT DETECTED Final   Candida parapsilosis NOT DETECTED NOT DETECTED Final   Candida tropicalis NOT DETECTED NOT DETECTED Final   Cryptococcus neoformans/gattii NOT DETECTED NOT DETECTED Final   CTX-M ESBL NOT DETECTED NOT DETECTED Final   Carbapenem resistance IMP NOT DETECTED NOT DETECTED Final   Carbapenem resistance KPC NOT DETECTED NOT DETECTED Final   Carbapenem resistance NDM NOT DETECTED NOT DETECTED Final   Carbapenem resist OXA 48 LIKE NOT DETECTED NOT DETECTED Final   Carbapenem resistance VIM NOT DETECTED NOT DETECTED Final    Comment: Performed at [facility], [address] [place], [place] [address]         Radiology Studies: CT HEAD WO CONTRAST (5MM)  Result Date: 06/26/2021 CLINICAL DATA:  Mental status change. EXAM: CT HEAD WITHOUT CONTRAST TECHNIQUE: Contiguous axial images were obtained from the base of the skull through the vertex without intravenous contrast. COMPARISON:  None. FINDINGS: Brain: No evidence of acute infarction, hemorrhage, hydrocephalus, extra-axial collection or mass lesion/mass effect. Prominence of the sulci noted. There is mild diffuse low-attenuation within the subcortical and periventricular white matter  compatible with chronic microvascular disease. Vascular: No hyperdense vessel or unexpected calcification. Skull: Normal. Negative for fracture or focal lesion. Sinuses/Orbits: No acute finding. Other: None IMPRESSION: 1. No acute intracranial abnormalities. 2. Chronic small vessel ischemic change and brain atrophy. Electronically Signed   By: Kerby Moors M.D.   On: 06/26/2021 10:54   CT ABDOMEN PELVIS W CONTRAST  Addendum Date: 06/26/2021   ADDENDUM REPORT: 06/26/2021 11:30 ADDENDUM: There is also soft tissue nodularity along the superior aspect of the gallbladder neck that is best seen on coronal image 47 of series 5 but also on axial image 29 of series 2 this measures approximately 13 mm. It is unclear whether this the constellation of findings represents biliary primary with metastasis or pancreatic primary with metastasis. Ductal obstruction occurs at the level of the pancreas rather than at this location. These results were called by telephone at the time of interpretation on 06/26/2021 at 11:29 am to provider Duffy Bruce , who verbally acknowledged these results. Electronically Signed   By: Zetta Bills M.D.   On: 06/26/2021 11:30   Result Date: 06/26/2021 CLINICAL DATA:  Abdominal pain, acute nonlocalized abdominal pain in a 70 year old female. EXAM: CT ABDOMEN AND PELVIS WITH CONTRAST TECHNIQUE: Multidetector CT imaging of the abdomen and pelvis was performed using the standard protocol following bolus administration of intravenous contrast. CONTRAST:  167mL OMNIPAQUE IOHEXOL 300 MG/ML  SOLN COMPARISON:  None available FINDINGS: Lower chest: Mild septal thickening and scarring at the lung bases. Cardiac enlargement with mitral annular calcifications and signs of coronary artery calcification. Hepatobiliary: Profound biliary duct dilation with abrupt termination of the common bile duct in the region of the pancreatic head. Irregular wall thickening with interruption of gallbladder wall  enhancement and areas of septation throughout the gallbladder. Nodular area on image 29 of series 2 measuring 13 mm. Marked pericholecystic stranding and associated intrahepatic biliary duct dilation. No visible suspicious lesion in the liver on venous phase. The portal vein is mildly distorted at the level of the pancreas. The portal vein does remain patent. Hepatic veins are patent. Potential fluid collection in the gallbladder fossa measuring up to 3.5 x 2.0 cm appears to communicate with the lumen of the gallbladder Pancreas: Abrupt termination of the pancreatic duct in the neck of the pancreas. Soft tissue posterior to the SMV (image 30/2)  this is at the level of the splenic portal confluence and also the level of biliary duct obstruction. Small lymph nodes throughout the celiac no additional soft tissue or findings suggestive of frank adenopathy. Peripheral pancreatic duct dilation without substantial surrounding stranding. Spleen: Mild splenic enlargement. Adrenals/Urinary Tract: Adrenal glands are normal. There is symmetric renal enhancement. No hydronephrosis. No suspicious renal lesion. Stomach/Bowel: Hiatal hernia.  Colonic diverticulosis. Vascular/Lymphatic: Distortion of the portal vein. Potential involvement of the common hepatic artery with stranding extending along the distal aspect of the celiac trunk as well. Atherosclerotic changes throughout the abdominal aorta. No adenopathy in the pelvis. Reproductive: Unremarkable by CT. Other: No ascites.  No signs of free air. Musculoskeletal: No acute bone finding. No destructive bone process. Spinal degenerative changes. 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 Soft tissue about the celiac with distortion of the portal vein associated with the site of biliary obstruction and adjacent to level of pancreatic duct  obstruction. This may represent a combination of nodal disease and primary pancreatic neoplasm versus direct extension of pancreatic neoplasm. Distortion of the portal vein is best illustrated on sagittal images. Profound biliary duct dilation of both extrahepatic and intrahepatic biliary tree. Electronically Signed: By: Zetta Bills M.D. On: 06/26/2021 11:06   DG Chest Port 1 View  Result Date: 06/26/2021 CLINICAL DATA:  Altered mental status.  Possible sepsis.  Jaundice. EXAM: PORTABLE CHEST 1 VIEW COMPARISON:  None. FINDINGS: The heart is borderline enlarged with a left ventricular configuration. Moderate tortuosity of the thoracic aorta. The lungs are clear. No infiltrates or effusions. No pulmonary lesions. The bony thorax is intact. IMPRESSION: No acute cardiopulmonary findings. Electronically Signed   By: Marijo Sanes M.D.   On: 06/26/2021 10:01   DG C-Arm 1-60 Min-No Report  Result Date: 06/26/2021 Fluoroscopy was utilized by the requesting physician.  No radiographic interpretation.   US Abdomen Limited RUQ (LIVER/GB)  Result Date: 06/26/2021 CLINICAL DATA:  Acute right upper quadrant abdominal pain. EXAM: ULTRASOUND ABDOMEN LIMITED RIGHT UPPER QUADRANT COMPARISON:  CT of same day. FINDINGS: Gallbladder: Cholelithiasis is noted with moderate gallbladder wall thickening measuring 5 mm. No sonographic Murphy's sign is noted. No pericholecystic fluid is noted. Common bile duct: Diameter: 13 mm which is dilated and concerning for distal common bile duct obstruction. Liver: No focal lesion identified. Within normal limits in parenchymal echogenicity. Intrahepatic biliary dilatation is noted. Portal vein is patent on color Doppler imaging with normal direction of blood flow towards the liver. Other: None. IMPRESSION: Cholelithiasis is noted with moderate gallbladder wall thickening concerning for cholecystitis. Intrahepatic and extrahepatic biliary dilatation is noted concerning for distal  common bile duct obstruction. Potentially this is due to pancreatic mass as suggested on prior CT scan. Electronically Signed   By: Marijo Conception M.D.   On: 06/26/2021 11:45        Scheduled Meds:  midodrine  10 mg Oral TID WC   Continuous Infusions:  0.9 % NaCl with KCl 40 mEq / L 125 mL/hr at 06/27/21 1208   phytonadione (VITAMIN K) IV     piperacillin-tazobactam (ZOSYN)  IV       LOS: 1 day    Time spent: 48 minutes, more than 50% time involving direct patient care.    Sharen Hones, MD [facility]   To contact the attending provider between 7A-7P or the covering provider during after hours 7P-7A,  please log into the web site [email or web address] and access using universal [place] password for that web site. If you do not have the password, please call the hospital operator.  06/27/2021, 1:15 PM

## 2021-06-27 NOTE — Progress Notes (Signed)
[facility] SURGICAL PROGRESS NOTE (cpt [phone])  Hospital Day(s): 1.   Interval History: Patient seen and examined, no acute events or new complaints overnight. Patient reports she is overall feeling improved. She remains markedly juandice. No abdominal pain, nausea, emesis., fever. She underwent ERCP and stent placement with gastroenterology yesterday (11/29). This morning, her leukocytosis has improved some; down  to 13.2K. renal function remains normal; sCr - 0.35; UO - unmeasured. Hypokalemia resolved; now 3.7. Hyperbilirubinemia is coming down; now 18.0 (from 20.9). BCx with pan-sensitive E coli on 3/4 bottle, 1/4 bottles with staph epi. She continues on Cefepime, Vancomycin, and Flagyl. Attempts made to transfer to tertiary center for EUS and tissue biopsy were unsuccessful. She is currently NPO.   Review of Systems:  Constitutional: denies fever, chills  HEENT: denies cough or congestion  Respiratory: denies any shortness of breath  Cardiovascular: denies chest pain or palpitations  Gastrointestinal: denies abdominal pain, N/V Genitourinary: denies burning with urination or urinary frequency Integumentary: + Juandice   Vital signs in last 24 hours: [min-max] current  Temp:  [97.4 F (36.3 C)-99.1 F (37.3 C)] 98 F (36.7 C) (11/30 0308) Pulse Rate:  [63-114] 63 (11/30 0308) Resp:  [16-28] 17 (11/30 0308) BP: (90-125)/(51-86) 110/64 (11/30 0308) SpO2:  [94 %-100 %] 97 % (11/30 0308) Weight:  [108.9 kg] 108.9 kg (11/29 0904)     Height: 4\' 9"  (144.8 cm) Weight: 108.9 kg BMI (Calculated): 51.92   Intake/Output last 2 shifts:  11/29 0701 - 11/30 0700 In: 2843.1 [I.V.:542.8; IV Piggyback:2300.3] Out: 10 [Blood:10]   Physical Exam:  Constitutional: alert, cooperative and no distress  HENT: normocephalic without obvious abnormality  Eyes: PERRL, EOM's grossly intact and symmetric, + scleral icterus  Respiratory: breathing non-labored at rest  Cardiovascular:  regular rate and sinus rhythm  Gastrointestinal: Soft, non-tender, non-distended, no rebound/guarding Integumentary: + Juandice, warm. dry   Labs:  CBC Latest Ref Rng & Units 06/27/2021 06/26/2021  WBC 4.0 - 10.5 K/uL 13.2(H) 14.0(H)  Hemoglobin 12.0 - 15.0 g/dL 9.5(L) 11.5(L)  Hematocrit 36.0 - 46.0 % 29.2(L) 34.4(L)  Platelets 150 - 400 K/uL 371 439(H)   CMP Latest Ref Rng & Units 06/27/2021 06/26/2021 06/26/2021  Glucose 70 - 99 mg/dL 133(H) 129(H) 117(H)  BUN 8 - 23 mg/dL 11 7(L) 10  Creatinine 0.44 - 1.00 mg/dL 0.35(L) <0.30(L) <0.30(L)  Sodium 135 - 145 mmol/L 132(L) 130(L) 129(L)  Potassium 3.5 - 5.1 mmol/L 3.7 3.3(L) 2.7(LL)  Chloride 98 - 111 mmol/L 104 99 91(L)  CO2 22 - 32 mmol/L 25 24 27   Calcium 8.9 - 10.3 mg/dL 7.7(L) 7.7(L) 8.5(L)  Total Protein 6.5 - 8.1 g/dL 6.2(L) - 7.4  Total Bilirubin 0.3 - 1.2 mg/dL 18.0(H) - 20.9(HH)  Alkaline Phos 38 - 126 U/L 386(H) - 521(H)  AST 15 - 41 U/L 84(H) - 105(H)  ALT 0 - 44 U/L 37 - 47(H)     Imaging studies: No new pertinent imaging studies   Assessment/Plan: (ICD-10's: K81.0) 70 y.o. female with leukocytosis, LFT elevation, and hyperbilirubinemia concerning for obstructing juandice secondary to pancreatic vs biliary malignancy with possible concomitant cholecystitis.    - Appreciate medicine admission; Agree that it remains reasonable to attempt transfer to tertiary center given complexity of this case and lack of EUS for diagnosis at this facility.    - Appreciate GI assistance with ERCP; images and report reviewed    - Interventional radiology following for potential percutaneous cholecystostomy tube placement   - MRCP today to  re-evaluate biliary system  - Continue NPO for now as to not delay any potential procedures  - Continue IV Abx (Cefepime, Vancomycin, and Flagyl); Initial Bcx reviewed, okay to deescalate  - Monitor abdominal examination; on-going bowel function               - Pain control prn; antiemetics  prn - Monitor hyperbilirubinemia; improved             - Further management per primary service; we will follow  With the help of Spanish-English medical interpretor, all of the above findings and recommendations were discussed with the patient, and all of patient's questions were answered to her expressed satisfaction.  -- Edison Simon, PA-C [facility] 06/27/2021, 7:22 AM [phone] M-F: 7am - 4pm

## 2021-06-28 ENCOUNTER — Encounter: Payer: Self-pay | Admitting: Anesthesiology

## 2021-06-28 ENCOUNTER — Inpatient Hospital Stay: Payer: Medicare Other | Admitting: Radiology

## 2021-06-28 DIAGNOSIS — E876 Hypokalemia: Secondary | ICD-10-CM | POA: Diagnosis not present

## 2021-06-28 DIAGNOSIS — Z20822 Contact with and (suspected) exposure to covid-19: Secondary | ICD-10-CM | POA: Diagnosis not present

## 2021-06-28 DIAGNOSIS — K810 Acute cholecystitis: Secondary | ICD-10-CM | POA: Diagnosis not present

## 2021-06-28 DIAGNOSIS — A419 Sepsis, unspecified organism: Secondary | ICD-10-CM | POA: Diagnosis not present

## 2021-06-28 DIAGNOSIS — C259 Malignant neoplasm of pancreas, unspecified: Secondary | ICD-10-CM | POA: Diagnosis not present

## 2021-06-28 DIAGNOSIS — D6489 Other specified anemias: Secondary | ICD-10-CM | POA: Diagnosis not present

## 2021-06-28 DIAGNOSIS — M6281 Muscle weakness (generalized): Secondary | ICD-10-CM | POA: Diagnosis not present

## 2021-06-28 DIAGNOSIS — R17 Unspecified jaundice: Secondary | ICD-10-CM

## 2021-06-28 DIAGNOSIS — R4182 Altered mental status, unspecified: Secondary | ICD-10-CM | POA: Diagnosis not present

## 2021-06-28 DIAGNOSIS — C249 Malignant neoplasm of biliary tract, unspecified: Secondary | ICD-10-CM | POA: Diagnosis not present

## 2021-06-28 DIAGNOSIS — Z79899 Other long term (current) drug therapy: Secondary | ICD-10-CM | POA: Diagnosis not present

## 2021-06-28 DIAGNOSIS — A4151 Sepsis due to Escherichia coli [E. coli]: Secondary | ICD-10-CM | POA: Diagnosis not present

## 2021-06-28 DIAGNOSIS — Z6841 Body Mass Index (BMI) 40.0 and over, adult: Secondary | ICD-10-CM | POA: Diagnosis not present

## 2021-06-28 DIAGNOSIS — I10 Essential (primary) hypertension: Secondary | ICD-10-CM | POA: Diagnosis not present

## 2021-06-28 DIAGNOSIS — F419 Anxiety disorder, unspecified: Secondary | ICD-10-CM | POA: Diagnosis not present

## 2021-06-28 DIAGNOSIS — K831 Obstruction of bile duct: Secondary | ICD-10-CM | POA: Diagnosis not present

## 2021-06-28 DIAGNOSIS — K82A1 Gangrene of gallbladder in cholecystitis: Secondary | ICD-10-CM | POA: Diagnosis not present

## 2021-06-28 DIAGNOSIS — E8809 Other disorders of plasma-protein metabolism, not elsewhere classified: Secondary | ICD-10-CM | POA: Diagnosis not present

## 2021-06-28 DIAGNOSIS — R6520 Severe sepsis without septic shock: Secondary | ICD-10-CM | POA: Diagnosis not present

## 2021-06-28 DIAGNOSIS — K8309 Other cholangitis: Secondary | ICD-10-CM | POA: Diagnosis not present

## 2021-06-28 DIAGNOSIS — F32A Depression, unspecified: Secondary | ICD-10-CM | POA: Diagnosis not present

## 2021-06-28 DIAGNOSIS — D72829 Elevated white blood cell count, unspecified: Secondary | ICD-10-CM | POA: Diagnosis not present

## 2021-06-28 DIAGNOSIS — E878 Other disorders of electrolyte and fluid balance, not elsewhere classified: Secondary | ICD-10-CM | POA: Diagnosis not present

## 2021-06-28 DIAGNOSIS — E871 Hypo-osmolality and hyponatremia: Secondary | ICD-10-CM | POA: Diagnosis not present

## 2021-06-28 DIAGNOSIS — D75838 Other thrombocytosis: Secondary | ICD-10-CM | POA: Diagnosis not present

## 2021-06-28 HISTORY — PX: IR BILIARY DRAIN PLACEMENT WITH CHOLANGIOGRAM: IMG6043

## 2021-06-28 LAB — CBC WITH DIFFERENTIAL/PLATELET
Abs Immature Granulocytes: 0.49 10*3/uL — ABNORMAL HIGH (ref 0.00–0.07)
Basophils Absolute: 0.1 10*3/uL (ref 0.0–0.1)
Basophils Relative: 1 %
Eosinophils Absolute: 0.3 10*3/uL (ref 0.0–0.5)
Eosinophils Relative: 3 %
HCT: 29.8 % — ABNORMAL LOW (ref 36.0–46.0)
Hemoglobin: 9.6 g/dL — ABNORMAL LOW (ref 12.0–15.0)
Immature Granulocytes: 5 %
Lymphocytes Relative: 6 %
Lymphs Abs: 0.6 10*3/uL — ABNORMAL LOW (ref 0.7–4.0)
MCH: 30.3 pg (ref 26.0–34.0)
MCHC: 32.2 g/dL (ref 30.0–36.0)
MCV: 94 fL (ref 80.0–100.0)
Monocytes Absolute: 0.7 10*3/uL (ref 0.1–1.0)
Monocytes Relative: 7 %
Neutro Abs: 7.8 10*3/uL — ABNORMAL HIGH (ref 1.7–7.7)
Neutrophils Relative %: 78 %
Platelets: 429 10*3/uL — ABNORMAL HIGH (ref 150–400)
RBC: 3.17 MIL/uL — ABNORMAL LOW (ref 3.87–5.11)
RDW: 16.9 % — ABNORMAL HIGH (ref 11.5–15.5)
WBC: 10 10*3/uL (ref 4.0–10.5)
nRBC: 0 % (ref 0.0–0.2)

## 2021-06-28 LAB — COMPREHENSIVE METABOLIC PANEL
ALT: 36 U/L (ref 0–44)
AST: 69 U/L — ABNORMAL HIGH (ref 15–41)
Albumin: 1.5 g/dL — ABNORMAL LOW (ref 3.5–5.0)
Alkaline Phosphatase: 344 U/L — ABNORMAL HIGH (ref 38–126)
Anion gap: 5 (ref 5–15)
BUN: 15 mg/dL (ref 8–23)
CO2: 22 mmol/L (ref 22–32)
Calcium: 7.4 mg/dL — ABNORMAL LOW (ref 8.9–10.3)
Chloride: 110 mmol/L (ref 98–111)
Creatinine, Ser: 0.35 mg/dL — ABNORMAL LOW (ref 0.44–1.00)
GFR, Estimated: >60 mL/min (ref >60)
Glucose, Bld: 73 mg/dL (ref 70–99)
Potassium: 4.1 mmol/L (ref 3.5–5.1)
Sodium: 137 mmol/L (ref 135–145)
Total Bilirubin: 14.3 mg/dL — ABNORMAL HIGH (ref 0.3–1.2)
Total Protein: 5.6 g/dL — ABNORMAL LOW (ref 6.5–8.1)

## 2021-06-28 LAB — MAGNESIUM: Magnesium: 1.9 mg/dL (ref 1.7–2.4)

## 2021-06-28 LAB — PHOSPHORUS: Phosphorus: UNDETERMINED mg/dL (ref 2.5–4.6)

## 2021-06-28 MED ORDER — MIDAZOLAM HCL 2 MG/2ML IJ SOLN
INTRAMUSCULAR | Status: AC
  Filled 2021-06-28: qty 2

## 2021-06-28 MED ORDER — HYDROMORPHONE HCL 1 MG/ML IJ SOLN
0.5000 mg | INTRAMUSCULAR | Status: DC | PRN
  Administered 2021-06-28 – 2021-06-29 (×3): 0.5 mg via INTRAVENOUS
  Filled 2021-06-28 (×3): qty 1

## 2021-06-28 MED ORDER — LIDOCAINE HCL 1 % IJ SOLN
INTRAMUSCULAR | Status: AC
  Administered 2021-06-28: 20 mL
  Filled 2021-06-28: qty 20

## 2021-06-28 MED ORDER — MIDAZOLAM HCL 2 MG/2ML IJ SOLN
INTRAMUSCULAR | Status: AC | PRN
  Administered 2021-06-28: 1 mg via INTRAVENOUS
  Administered 2021-06-28: .5 mg via INTRAVENOUS

## 2021-06-28 MED ORDER — MIDODRINE HCL 5 MG PO TABS
5.0000 mg | ORAL_TABLET | Freq: Three times a day (TID) | ORAL | Status: DC
  Administered 2021-06-29 – 2021-06-30 (×6): 5 mg via ORAL
  Filled 2021-06-28 (×4): qty 1

## 2021-06-28 MED ORDER — FENTANYL CITRATE (PF) 100 MCG/2ML IJ SOLN
INTRAMUSCULAR | Status: AC
  Filled 2021-06-28: qty 2

## 2021-06-28 MED ORDER — FENTANYL CITRATE (PF) 100 MCG/2ML IJ SOLN
INTRAMUSCULAR | Status: AC | PRN
  Administered 2021-06-28: 50 ug via INTRAVENOUS
  Administered 2021-06-28 (×3): 25 ug via INTRAVENOUS

## 2021-06-28 MED ORDER — IOHEXOL 350 MG/ML SOLN
60.0000 mL | Freq: Once | INTRAVENOUS | Status: AC | PRN
  Administered 2021-06-28: 60 mL

## 2021-06-28 MED ORDER — MIDAZOLAM HCL 5 MG/5ML IJ SOLN
INTRAMUSCULAR | Status: AC | PRN
  Administered 2021-06-28: 1 mg via INTRAVENOUS

## 2021-06-28 MED ORDER — LIDOCAINE HCL 1 % IJ SOLN
INTRAMUSCULAR | Status: AC
  Filled 2021-06-28: qty 20

## 2021-06-28 MED ORDER — DEXTROSE-NACL 5-0.9 % IV SOLN: INTRAVENOUS | Status: DC

## 2021-06-28 NOTE — Procedures (Signed)
Interventional Radiology Procedure Note  Procedure:  1) Percutaneous cholangiogram 2) Right sided transhepatic external biliary drain placement  Findings: Please refer to procedural dictation for full description.  Right 10.2 Fr biliary drain placed, external drainage.  Unable to traverse hilar obstruction.  Moderate to severe bilateral intrahepatic biliary ductal dilation.    Complications: None immediate  Estimated Blood Loss: 5 mL  Recommendations: Keep to bag drainage. Expect some hemobilia. Flush drain with 5 mL saline TID. Continue to monitor bilirubin. IR will follow along.  She may be a candidate for attempt at internalization and brush biopsy at a later date.   Ruthann Cancer, MD Pager: [phone]

## 2021-06-28 NOTE — Consult Note (Signed)
Chief Complaint: Patient was seen in consultation today for biliary obstruction  Referring Physician(s): Edison Simon, PA-C  History of Present Illness: Rachel Montoya is a 70 y.o. female who presented to [facility] on 06/26/21 with approximately one week history of abdominal pain, nausea, and vomiting.  On presentation she was noted to be hyperbilirubinemic.  CT abdomen/pelvis was obtained on 06/26/21 which demonstrated diffuse intrahepatic biliary ductal dilation secondary to possible hilar vs. pancreatic mass in addition to cholelithiasis and possible signs of cholecystitis.  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  A plastic stent was placed which helped decompress the common bile duct but there was persistent contrast within the right intrahepatic ducts.  MRI/MRCP was then performed on 06/27/21 for further evaluation of possible mass, which demonstrated persistent intrahepatic biliary ductal dilation without definitive characterization of obstructive process.    She was seen at bedside today accompanied by her daughter and Spanish video interpreter.  She has no specific complaints.  History reviewed. No pertinent past medical history.  Past Surgical History:  Procedure Laterality Date   ERCP N/A 06/26/2021   Procedure: ENDOSCOPIC RETROGRADE CHOLANGIOPANCREATOGRAPHY (ERCP);  Surgeon: Lucilla Lame, MD;  Location: [facility] ENDOSCOPY;  Service: Endoscopy;  Laterality: N/A;    Allergies: Patient has no known allergies.  Medications: Prior to Admission medications   Not on File     History reviewed. No pertinent family history.  Social History   Socioeconomic History   Marital status: Single    Spouse name: Not on file   Number of children: Not on file   Years of education: Not on file   Highest education level: Not on file  Occupational History   Not on file  Tobacco Use   Smoking  status: Unknown   Smokeless tobacco: Not on file  Vaping Use   Vaping Use: Not on file  Substance and Sexual Activity   Alcohol use: Not Currently   Drug use: Not on file   Sexual activity: Not on file  Other Topics Concern   Not on file  Social History Narrative   Not on file   Social Determinants of Health   Financial Resource Strain: Not on file  Food Insecurity: Not on file  Transportation Needs: Not on file  Physical Activity: Not on file  Stress: Not on file  Social Connections: Not on file    Review of Systems: A 12 point ROS discussed and pertinent positives are indicated in the HPI above.  All other systems are negative.   Vital Signs: BP 113/67 (BP Location: Right Arm)   Pulse 90   Temp 98.1 F (36.7 C)   Resp 15   Ht 4\' 9"  (1.448 m)   Wt 108.9 kg   SpO2 100%   BMI 51.94 kg/m   Physical Exam  Imaging: MRI Abd 06/27/21  Labs:  CBC: Recent Labs    06/26/21 0858 06/27/21 0215 06/28/21 0441  WBC 14.0* 13.2* 10.0  HGB 11.5* 9.5* 9.6*  HCT 34.4* 29.2* 29.8*  PLT 439* 371 429*    COAGS: Recent Labs    06/26/21 0858 06/27/21 0215  INR 1.3* 1.5*  APTT 28  --     BMP: Recent Labs    06/26/21 0858 06/26/21 1550 06/27/21 0215 06/28/21 0441  NA 129* 130* 132* 137  K 2.7* 3.3* 3.7 4.1  CL 91* 99 104 110  CO2 27 24 25  22  GLUCOSE 117* 129* 133* 73  BUN 10 7* 11 15  CALCIUM 8.5* 7.7* 7.7* 7.4*  CREATININE <0.30* <0.30* 0.35* 0.35*  GFRNONAA NOT CALCULATED NOT CALCULATED >60 >60    LIVER FUNCTION TESTS: Recent Labs    06/26/21 0858 06/27/21 0215 06/28/21 0441  BILITOT 20.9* 18.0* 14.3*  AST 105* 84* 69*  ALT 47* 37 36  ALKPHOS 521* 386* 344*  PROT 7.4 6.2* 5.6*  ALBUMIN 2.0* 1.6* 1.5*    TUMOR MARKERS: No results for input(s): AFPTM, CEA, CA199, CHROMGRNA in the last 8760 hours.  Assessment and Plan: 70 year old female with recently diagnosed biliary obstruction likely secondary to hilar versus pancreatic malignant process  status post ERCP with common bile duct stent placement on 06/26/21 and post-procedural evidence of persistent intrahepatic biliary obstruction.  Her leukocytosis is resolving and total bilirubin is decreasing, albeit slower than expected after decompression.    Plan for image-guided percutaneous transhepatic biliary drain placement with moderate sedation.  Risks and benefits discussed with the patient including bleeding, infection, damage to adjacent structures, bowel perforation/fistula connection, and sepsis.  All of the patient's questions were answered, patient is agreeable to proceed. Consent signed and in chart.    Electronically Signed: Suzette Battiest, MD 06/28/2021, 11:12 AM   I spent a total of 40 Minutes  in face to face in clinical consultation, greater than 50% of which was counseling/coordinating care for biliary obstruction.

## 2021-06-28 NOTE — Progress Notes (Signed)
PROGRESS NOTE    Rachel Montoya  RCV:[id number] DOB: [birth year] DOA: 06/26/2021 PCP: Pcp, No   Chief complaint.  Jaundice. Brief Narrative:  Rachel Montoya is a 70 y.o. Hispanic female with medical history significant for depression, anxiety and hypertension coming with altered mental status with confusion as well as nausea and vomiting since 11/23 with associated jaundice, chills and right upper quadrant abdominal pain. Patient has a bilirubin of 18.  CT abdomen/pelvis showed acute gangrenous cholecystitis, biliary duct dilatation with possible obstruction at the level of pancreas.  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 Patient had a ERCP performed 11/29 by Dr. Allen Norris, due to multiple biliary obstructions, it is uncertain whether this obstruction has been resolved.  Patient also pending for cholecystostomy.  And followed by general surgery.   Assessment & Plan:   Principal Problem:   Obstructive jaundice Active Problems:   Gangrenous cholecystitis   Hyponatremia   Hypokalemia   Severe sepsis (HCC)  Obstructive jaundice secondary to pancreatic/biliary mass. Gangrenous cholecystitis. Severe sepsis. Patient is status post ERCP. 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 Jaundice is improving, bilirubin dropped down to 14.3. Continue Zosyn for cholecystitis. Patient is scheduled for transhepatic drain by IR today. Patient is also on the waiting list for transfer to [facility].   Hypokalemia. Hyponatremia. Potassium normalized.  Continue fluids.   Morbid obesity with BMI of 51.94.   Severe hypoalbuminemia.  DVT prophylaxis: SCDs Code Status: full Family Communication: Daughter updated. Disposition Plan:    Status is: Inpatient  Remains inpatient appropriate because: Severity of the disease, IV antibiotics, pending procedures.        I/O last 3 completed shifts: In: 3163.3 [I.V.:2882.1; IV Piggyback:281.2] Out: 0  No  intake/output data recorded.     Consultants:  GI, general surgery, interventional radiology.  Procedures: ERCP  Antimicrobials: zosyn  Subjective: Patient feels well today, no significant abdominal pain or nausea vomiting.  She is pending for procedure today. Denies any short of breath or cough. No fever or chills. No dysuria hematuria.  Objective: Vitals:   06/27/21 2334 06/28/21 0553 06/28/21 0805 06/28/21 1137  BP: 99/70 106/63 113/67 (!) 109/55  Pulse: 72 78 90 85  Resp: 17 16 15 15   Temp: (!) 97.5 F (36.4 C) 97.7 F (36.5 C) 98.1 F (36.7 C) 97.6 F (36.4 C)  TempSrc:      SpO2: 99% 99% 100% 99%  Weight:      Height:        Intake/Output Summary (Last 24 hours) at 06/28/2021 1220 Last data filed at 06/28/2021 1025 Gross per 24 hour  Intake 2520.19 ml  Output --  Net 2520.19 ml   Filed Weights   06/26/21 0904  Weight: 108.9 kg    Examination:  General exam: Appears calm and comfortable  Respiratory system: Clear to auscultation. Respiratory effort normal. Cardiovascular system: S1 & S2 heard, RRR. No JVD, murmurs, rubs, gallops or clicks. No pedal edema. Gastrointestinal system: Abdomen is nondistended, soft and nontender. No organomegaly or masses felt. Normal bowel sounds heard. Central nervous system: Alert and oriented. No focal neurological deficits. Extremities: Symmetric 5 x 5 power. Skin: yellow Psychiatry: Judgement and insight appear normal. Mood & affect appropriate.     Data Reviewed: I have personally reviewed following labs and imaging studies  CBC: Recent Labs  Lab 06/26/21 0858 06/27/21 0215 06/28/21 0441  WBC 14.0* 13.2* 10.0  NEUTROABS 12.0*  --  7.8*  HGB 11.5* 9.5* 9.6*  HCT 34.4* 29.2*  29.8*  MCV 92.0 92.1 94.0  PLT 439* 371 865*   Basic Metabolic Panel: Recent Labs  Lab 06/26/21 0858 06/26/21 1550 06/27/21 0215 06/28/21 0441  NA 129* 130* 132* 137  K 2.7* 3.3* 3.7 4.1  CL 91* 99 104 110  CO2 27 24 25 22    GLUCOSE 117* 129* 133* 73  BUN 10 7* 11 15  CREATININE <0.30* <0.30* 0.35* 0.35*  CALCIUM 8.5* 7.7* 7.7* 7.4*  MG 1.9  --   --  1.9  PHOS  --   --   --  UNABLE TO REPORT DUE TO ICTERUS   GFR: Estimated Creatinine Clearance: 66.9 mL/min (A) (by C-G formula based on SCr of 0.35 mg/dL (L)). Liver Function Tests: Recent Labs  Lab 06/26/21 0858 06/27/21 0215 06/28/21 0441  AST 105* 84* 69*  ALT 47* 37 36  ALKPHOS 521* 386* 344*  BILITOT 20.9* 18.0* 14.3*  PROT 7.4 6.2* 5.6*  ALBUMIN 2.0* 1.6* 1.5*   No results for input(s): LIPASE, AMYLASE in the last 168 hours. Recent Labs  Lab 06/26/21 0858  AMMONIA 47*   Coagulation Profile: Recent Labs  Lab 06/26/21 0858 06/27/21 0215  INR 1.3* 1.5*   Cardiac Enzymes: No results for input(s): CKTOTAL, CKMB, CKMBINDEX, TROPONINI in the last 168 hours. BNP (last 3 results) No results for input(s): PROBNP in the last 8760 hours. HbA1C: No results for input(s): HGBA1C in the last 72 hours. CBG: No results for input(s): GLUCAP in the last 168 hours. Lipid Profile: No results for input(s): CHOL, HDL, LDLCALC, TRIG, CHOLHDL, LDLDIRECT in the last 72 hours. Thyroid Function Tests: No results for input(s): TSH, T4TOTAL, FREET4, T3FREE, THYROIDAB in the last 72 hours. Anemia Panel: No results for input(s): VITAMINB12, FOLATE, FERRITIN, TIBC, IRON, RETICCTPCT in the last 72 hours. Sepsis Labs: Recent Labs  Lab 06/26/21 0858 06/26/21 2118 06/26/21 2253 06/27/21 0215  PROCALCITON  --   --   --  1.29  LATICACIDVEN 1.7 1.2 1.1  --     Recent Results (from the past 240 hour(s))  Blood Culture (routine x 2)     Status: None (Preliminary result)   Collection Time: 06/26/21  8:58 AM   Specimen: BLOOD  Result Value Ref Range Status   Specimen Description   Final    BLOOD BLOOD LEFT ARM Performed at [facility], [address], [place], [place] [address]    Special Requests   Final    BOTTLES DRAWN AEROBIC AND ANAEROBIC  Blood Culture adequate volume Performed at [facility], [address] [place], [place] [address]    Culture  Setup Time   Final    ANAEROBIC BOTTLE ONLY GRAM NEGATIVE RODS CRITICAL VALUE NOTED.  VALUE IS CONSISTENT WITH PREVIOUSLY REPORTED AND CALLED VALUE. AEROBIC BOTTLE ONLY GRAM POSITIVE COCCI Performed at [facility], [address] [place], [place] [address]    Culture   Final    GRAM NEGATIVE RODS CULTURE REINCUBATED FOR BETTER GROWTH STAPHYLOCOCCUS EPIDERMIDIS THE SIGNIFICANCE OF ISOLATING THIS ORGANISM FROM A SINGLE SET OF BLOOD CULTURES WHEN MULTIPLE SETS ARE DRAWN IS UNCERTAIN. PLEASE NOTIFY THE MICROBIOLOGY DEPARTMENT WITHIN ONE WEEK IF SPECIATION AND SENSITIVITIES ARE REQUIRED. Performed at [facility], [place] [address], [place], [place] [address]    Report Status PENDING  Incomplete  Resp Panel by RT-PCR (Flu A&B, Covid) Nasopharyngeal Swab     Status: None   Collection Time: 06/26/21  8:58 AM   Specimen: Nasopharyngeal Swab; Nasopharyngeal(NP) swabs in  vial transport medium  Result Value Ref Range Status   SARS Coronavirus 2 by RT PCR NEGATIVE NEGATIVE Final    Comment: (NOTE) SARS-CoV-2 target nucleic acids are NOT DETECTED.  The SARS-CoV-2 RNA is generally detectable in upper respiratory specimens during the acute phase of infection. The lowest concentration of SARS-CoV-2 viral copies this assay can detect is 138 copies/mL. A negative result does not preclude SARS-Cov-2 infection and should not be used as the sole basis for treatment or other patient management decisions. A negative result may occur with  improper specimen collection/handling, submission of specimen other than nasopharyngeal swab, presence of viral mutation(s) within the areas targeted by this assay, and inadequate number of viral copies(<138 copies/mL). A negative result must be combined with clinical observations, patient history, and epidemiological information.  The expected result is Negative.  Fact Sheet for Patients:  EntrepreneurPulse.com.au  Fact Sheet for Healthcare Providers:  IncredibleEmployment.be  This test is no t yet approved or cleared by the [place] FDA and  has been authorized for detection and/or diagnosis of SARS-CoV-2 by FDA under an Emergency Use Authorization (EUA). This EUA will remain  in effect (meaning this test can be used) for the duration of the COVID-19 declaration under Section 564(b)(1) of the Act, 21 U.S.C.section 360bbb-3(b)(1), unless the authorization is terminated  or revoked sooner.       Influenza A by PCR NEGATIVE NEGATIVE Final   Influenza B by PCR NEGATIVE NEGATIVE Final    Comment: (NOTE) The Xpert Xpress SARS-CoV-2/FLU/RSV plus assay is intended as an aid in the diagnosis of influenza from Nasopharyngeal swab specimens and should not be used as a sole basis for treatment. Nasal washings and aspirates are unacceptable for Xpert Xpress SARS-CoV-2/FLU/RSV testing.  Fact Sheet for Patients: EntrepreneurPulse.com.au  Fact Sheet for Healthcare Providers: IncredibleEmployment.be  This test is not yet approved or cleared by the [place] FDA and has been authorized for detection and/or diagnosis of SARS-CoV-2 by FDA under an Emergency Use Authorization (EUA). This EUA will remain in effect (meaning this test can be used) for the duration of the COVID-19 declaration under Section 564(b)(1) of the Act, 21 U.S.C. section 360bbb-3(b)(1), unless the authorization is terminated or revoked.  Performed at [facility], [address] [place], [place] [address]   Blood Culture ID Panel (Reflexed)     Status: Abnormal   Collection Time: 06/26/21  8:58 AM  Result Value Ref Range Status   Enterococcus faecalis NOT DETECTED NOT DETECTED Final   Enterococcus Faecium NOT DETECTED NOT DETECTED Final   Listeria  monocytogenes NOT DETECTED NOT DETECTED Final   Staphylococcus species DETECTED (A) NOT DETECTED Final    Comment: CRITICAL RESULT CALLED TO, READ BACK BY AND VERIFIED WITH: NATHAN BELUE PHARMD 0422 06/27/21 HNM    Staphylococcus aureus (BCID) NOT DETECTED NOT DETECTED Final   Staphylococcus epidermidis DETECTED (A) NOT DETECTED Final    Comment: CRITICAL RESULT CALLED TO, READ BACK BY AND VERIFIED WITH: NATHAN BELUE PHARMD 0422 06/27/21 HNM    Staphylococcus lugdunensis NOT DETECTED NOT DETECTED Final   Streptococcus species NOT DETECTED NOT DETECTED Final   Streptococcus agalactiae NOT DETECTED NOT DETECTED Final   Streptococcus pneumoniae NOT DETECTED NOT DETECTED Final   Streptococcus pyogenes NOT DETECTED NOT DETECTED Final   A.calcoaceticus-baumannii NOT DETECTED NOT DETECTED Final   Bacteroides fragilis NOT DETECTED NOT DETECTED Final   Enterobacterales NOT DETECTED NOT DETECTED Final   Enterobacter cloacae complex NOT DETECTED NOT DETECTED Final   Escherichia  coli NOT DETECTED NOT DETECTED Final   Klebsiella aerogenes NOT DETECTED NOT DETECTED Final   Klebsiella oxytoca NOT DETECTED NOT DETECTED Final   Klebsiella pneumoniae NOT DETECTED NOT DETECTED Final   Proteus species NOT DETECTED NOT DETECTED Final   Salmonella species NOT DETECTED NOT DETECTED Final   Serratia marcescens NOT DETECTED NOT DETECTED Final   Haemophilus influenzae NOT DETECTED NOT DETECTED Final   Neisseria meningitidis NOT DETECTED NOT DETECTED Final   Pseudomonas aeruginosa NOT DETECTED NOT DETECTED Final   Stenotrophomonas maltophilia NOT DETECTED NOT DETECTED Final   Candida albicans NOT DETECTED NOT DETECTED Final   Candida auris NOT DETECTED NOT DETECTED Final   Candida glabrata NOT DETECTED NOT DETECTED Final   Candida krusei NOT DETECTED NOT DETECTED Final   Candida parapsilosis NOT DETECTED NOT DETECTED Final   Candida tropicalis NOT DETECTED NOT DETECTED Final   Cryptococcus neoformans/gattii  NOT DETECTED NOT DETECTED Final   Methicillin resistance mecA/C NOT DETECTED NOT DETECTED Final    Comment: Performed at [facility], [address] [place], [place] [address]  Blood Culture (routine x 2)     Status: Abnormal (Preliminary result)   Collection Time: 06/26/21  8:59 AM   Specimen: BLOOD  Result Value Ref Range Status   Specimen Description   Final    BLOOD RIGHT ANTECUBITAL Performed at [facility], [address] [place], [place] [address]    Special Requests   Final    BOTTLES DRAWN AEROBIC AND ANAEROBIC Blood Culture adequate volume Performed at [facility], [address] [place], [place] [address]    Culture  Setup Time   Final    GRAM NEGATIVE RODS IN BOTH AEROBIC AND ANAEROBIC BOTTLES CRITICAL RESULT CALLED TO, READ BACK BY AND VERIFIED WITH: BRENNAN BEERS @ 2153 ON 06/26/21.Marland KitchenMarland KitchenTKR    Culture (A)  Final    ESCHERICHIA COLI SUSCEPTIBILITIES TO FOLLOW Performed at [facility], [place] [address] [place], [place] [address]    Report Status PENDING  Incomplete  Blood Culture ID Panel (Reflexed)     Status: Abnormal   Collection Time: 06/26/21  8:59 AM  Result Value Ref Range Status   Enterococcus faecalis NOT DETECTED NOT DETECTED Final   Enterococcus Faecium NOT DETECTED NOT DETECTED Final   Listeria monocytogenes NOT DETECTED NOT DETECTED Final   Staphylococcus species NOT DETECTED NOT DETECTED Final   Staphylococcus aureus (BCID) NOT DETECTED NOT DETECTED Final   Staphylococcus epidermidis NOT DETECTED NOT DETECTED Final   Staphylococcus lugdunensis NOT DETECTED NOT DETECTED Final   Streptococcus species NOT DETECTED NOT DETECTED Final   Streptococcus agalactiae NOT DETECTED NOT DETECTED Final   Streptococcus pneumoniae NOT DETECTED NOT DETECTED Final   Streptococcus pyogenes NOT DETECTED NOT DETECTED Final   A.calcoaceticus-baumannii NOT DETECTED NOT DETECTED Final   Bacteroides fragilis NOT DETECTED NOT DETECTED  Final   Enterobacterales DETECTED (A) NOT DETECTED Final    Comment: Enterobacterales represent a large order of gram negative bacteria, not a single organism. CRITICAL RESULT CALLED TO, READ BACK BY AND VERIFIED WITH: BRENNAN BEERS @ 2152 ON 06/26/21.Marland KitchenMarland KitchenTKR    Enterobacter cloacae complex NOT DETECTED NOT DETECTED Final   Escherichia coli DETECTED (A) NOT DETECTED Final    Comment: CRITICAL RESULT CALLED TO, READ BACK BY AND VERIFIED WITH: BRENNAN BEERS @ 2152 ON 06/26/21.Marland KitchenMarland KitchenTKR    Klebsiella aerogenes NOT DETECTED NOT DETECTED Final   Klebsiella oxytoca NOT DETECTED NOT DETECTED Final   Klebsiella pneumoniae NOT DETECTED NOT  DETECTED Final   Proteus species NOT DETECTED NOT DETECTED Final   Salmonella species NOT DETECTED NOT DETECTED Final   Serratia marcescens NOT DETECTED NOT DETECTED Final   Haemophilus influenzae NOT DETECTED NOT DETECTED Final   Neisseria meningitidis NOT DETECTED NOT DETECTED Final   Pseudomonas aeruginosa NOT DETECTED NOT DETECTED Final   Stenotrophomonas maltophilia NOT DETECTED NOT DETECTED Final   Candida albicans NOT DETECTED NOT DETECTED Final   Candida auris NOT DETECTED NOT DETECTED Final   Candida glabrata NOT DETECTED NOT DETECTED Final   Candida krusei NOT DETECTED NOT DETECTED Final   Candida parapsilosis NOT DETECTED NOT DETECTED Final   Candida tropicalis NOT DETECTED NOT DETECTED Final   Cryptococcus neoformans/gattii NOT DETECTED NOT DETECTED Final   CTX-M ESBL NOT DETECTED NOT DETECTED Final   Carbapenem resistance IMP NOT DETECTED NOT DETECTED Final   Carbapenem resistance KPC NOT DETECTED NOT DETECTED Final   Carbapenem resistance NDM NOT DETECTED NOT DETECTED Final   Carbapenem resist OXA 48 LIKE NOT DETECTED NOT DETECTED Final   Carbapenem resistance VIM NOT DETECTED NOT DETECTED Final    Comment: Performed at [facility], [address] [place], [place] [address]         Radiology Studies: MR 3D Recon At  Scanner  Result Date: 06/27/2021 CLINICAL DATA:  A 70 year old female presenting with jaundice with suspected pancreatic mass and biliary obstruction EXAM: MRI ABDOMEN WITHOUT AND WITH CONTRAST (INCLUDING MRCP) TECHNIQUE: Multiplanar multisequence MR imaging of the abdomen was performed both before and after the administration of intravenous contrast. Heavily T2-weighted images of the biliary and pancreatic ducts were obtained, and three-dimensional MRCP images were rendered by post processing. CONTRAST:  19mL GADAVIST GADOBUTROL 1 MMOL/ML IV SOLN COMPARISON:  Comparison is made with prior CT evaluation of June 26, 2021. FINDINGS: Lower chest: No effusion, no consolidative process. Limited assessment of the lung bases on MRI. Hepatobiliary: Marked intrahepatic biliary duct distension with similar appearance to previous imaging. Biliary stent has been placed since the previous exam traversing the area near the neck of the pancreas where there is ductal narrowing. Signs of pneumobilia post stent placement. No visible intrahepatic lesion. Associated with the superior wall the gallbladder is an area of intermediate T2 signal that also contacts the RIGHT hepatic artery and narrows the bile duct (image 10/15). This measures 16 x 11 mm. This shows enhancement. This corresponds to the enhancing area seen on the previous CT in this location. There is some debris in the distal common bile duct in the dependent aspect. Portal vein is patent. Irregular appearance of the gallbladder with numerous gallstones in the Cystic duct confluence with common hepatic duct appears to be involved at the more peripheral area of narrowing. Pancreas: Focal pancreatic duct dilation beginning just peripheral to the pancreatic neck (image 43/20) confluent hypointense tissue above the uncinate process posterior to the portal vein showing low level enhancement as well is posterior to the site of pancreatic narrowingaw. There is focal T1  hypointensity in the region of the neck of the pancreas though this is quite subtle. Perhaps measuring up to 1.6 x 1.3 cm (image 53/20) corresponding to T1 hypointensity seen on series 17 in this location. Posterior to the pancreas and splenic portal confluence is a 2.2 x 1.6 cm area of poorly enhancing tissue suspected to represent either direct extension or celiac nodal metastasis with indistinct margins and associated with the site of biliary duct obstruction along the LEFT lateral margin of the common bile  duct open image 45/22) Spleen:  Unremarkable. Adrenals/Urinary Tract:  No acute renal findings. Stomach/Bowel: Unremarkable to the extent evaluated on abdominal MRI. Vascular/Lymphatic: Distortion of the portal vein at the splenic portal confluence. Soft tissue about the common hepatic artery, RIGHT hepatic artery and the distal celiac trunk. 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 Other:  No ascites. Musculoskeletal: No suspicious bone lesions identified. 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 No substantial change from recent imaging. Two discrete areas of narrowing in the extrahepatic biliary tree, just above the cystic duct confluence and at the level of the cystic duct confluence secondary to poorly enhancing areas of tumor and or nodal disease. Pancreatic duct obstruction adjacent to site of biliary obstruction with confluent soft tissue near the celiac suspicious for pancreatic primary. This is favored though unusual presentation for biliary primary is considered based on poorly enhancing nodularity near the gallbladder. Signs of potential vascular involvement as outlined on previous imaging. Pancreatic protocol CT  could be helpful. Ultimately EUS may be required for diagnosis. Post biliary stent placement with persistent marked biliary duct distension. Perhaps stent does not traverse both areas of ductal narrowing. There is however pneumobilia within the intrahepatic bile ducts. The stent is poorly evaluated on the current study. Correlate with ERCP results and intraprocedural data. These results will be called to the ordering clinician or representative by the Radiologist Assistant, and communication documented in the PACS or [employer]. Electronically Signed   By: Zetta Bills M.D.   On: 06/27/2021 16:28   DG C-Arm 1-60 Min-No Report  Result Date: 06/26/2021 Fluoroscopy was utilized by the requesting physician.  No radiographic interpretation.   MR ABDOMEN MRCP W WO CONTAST  Result Date: 06/27/2021 CLINICAL DATA:  A 70 year old female presenting with jaundice with suspected pancreatic mass and biliary obstruction EXAM: MRI ABDOMEN WITHOUT AND WITH CONTRAST (INCLUDING MRCP) TECHNIQUE: Multiplanar multisequence MR imaging of the abdomen was performed both before and after the administration of intravenous contrast. Heavily T2-weighted images of the biliary and pancreatic ducts were obtained, and three-dimensional MRCP images were rendered by post processing. CONTRAST:  47mL GADAVIST GADOBUTROL 1 MMOL/ML IV SOLN COMPARISON:  Comparison is made with prior CT evaluation of June 26, 2021. FINDINGS: Lower chest: No effusion, no consolidative process. Limited assessment of the lung bases on MRI. Hepatobiliary: Marked intrahepatic biliary duct distension with similar appearance to previous imaging. Biliary stent has been placed since the previous exam traversing the area near the neck of the pancreas where there is ductal narrowing. Signs of pneumobilia post stent placement. No visible intrahepatic lesion. Associated with the superior wall the gallbladder is an area of intermediate T2 signal that also contacts  the RIGHT hepatic artery and narrows the bile duct (image 10/15). This measures 16 x 11 mm. This shows enhancement. This corresponds to the enhancing area seen on the previous CT in this location. There is some debris in the distal common bile duct in the dependent aspect. Portal vein is patent. Irregular appearance of the gallbladder with numerous gallstones in the Cystic duct confluence with common hepatic duct appears to be involved at the more peripheral area of narrowing. Pancreas: Focal pancreatic duct dilation beginning just peripheral to the pancreatic neck (image 43/20) confluent hypointense tissue above the uncinate process posterior to the portal  vein showing low level enhancement as well is posterior to the site of pancreatic narrowingaw. There is focal T1 hypointensity in the region of the neck of the pancreas though this is quite subtle. Perhaps measuring up to 1.6 x 1.3 cm (image 53/20) corresponding to T1 hypointensity seen on series 17 in this location. Posterior to the pancreas and splenic portal confluence is a 2.2 x 1.6 cm area of poorly enhancing tissue suspected to represent either direct extension or celiac nodal metastasis with indistinct margins and associated with the site of biliary duct obstruction along the LEFT lateral margin of the common bile duct open image 45/22) Spleen:  Unremarkable. Adrenals/Urinary Tract:  No acute renal findings. Stomach/Bowel: Unremarkable to the extent evaluated on abdominal MRI. Vascular/Lymphatic: Distortion of the portal vein at the splenic portal confluence. Soft tissue about the common hepatic artery, RIGHT hepatic artery and the distal celiac trunk. 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 Other:  No ascites. Musculoskeletal: No suspicious bone lesions identified. IMPRESSION: Irregular appearance of the gallbladder with septated changes and  cholelithiasis potentially related to acute gangrenous process superimposed on chronic cholecystitis in the setting of ductal obstruction. No substantial change from recent imaging. Two discrete areas of narrowing in the extrahepatic biliary tree, just above the cystic duct confluence and at the level of the cystic duct confluence secondary to poorly enhancing areas of tumor and or nodal disease. Pancreatic duct obstruction adjacent to site of biliary obstruction with confluent soft tissue near the celiac suspicious for pancreatic primary. This is favored though unusual presentation for biliary primary is considered based on poorly enhancing nodularity near the gallbladder. Signs of potential vascular involvement as outlined on previous imaging. Pancreatic protocol CT could be helpful. Ultimately EUS may be required for diagnosis. Post biliary stent placement with persistent marked biliary duct distension. Perhaps stent does not traverse both areas of ductal narrowing. There is however pneumobilia within the intrahepatic bile ducts. The stent is poorly evaluated on the current study. Correlate with ERCP results and intraprocedural data. These results will be called to the ordering clinician or representative by the Radiologist Assistant, and communication documented in the PACS or [employer]. Electronically Signed   By: Zetta Bills M.D.   On: 06/27/2021 16:28        Scheduled Meds:  midodrine  10 mg Oral TID WC   Continuous Infusions:  0.9 % NaCl with KCl 40 mEq / L 125 mL/hr at 06/28/21 1028   piperacillin-tazobactam (ZOSYN)  IV 3.375 g (06/28/21 0632)     LOS: 2 days    Time spent: 32 minutes, more than 50% time involved in direct patient care.    Sharen Hones, MD [facility]   To contact the attending provider between 7A-7P or the covering provider during after hours 7P-7A, please log into the web site [email or web address] and access using universal [place] password for that  web site. If you do not have the password, please call the hospital operator.  06/28/2021, 12:20 PM

## 2021-06-28 NOTE — Plan of Care (Signed)

## 2021-06-28 NOTE — Progress Notes (Signed)
[facility] SURGICAL PROGRESS NOTE (cpt [phone])  Hospital Day(s): 2.   Interval History: Patient seen and examined, no acute events or new complaints overnight. Patient reports she is feeling "so-so" this morning. She denies fever, chills, nausea, emesis. She reports minimal abdominal discomfort but thinks this is "from the positioning." Previously seen leukocytosis is now resolved; WBC 10.0K. Renal function normal with sCr - 0.35; UO - unmeasured. Hyperbilirubinemia improving; down to 14.3. No significant electrolyte derangements. She continues on Zosyn. Attempts made to transfer to tertiary center for EUS and tissue biopsy were unsuccessful. She is currently NPO.   Review of Systems:  Constitutional: denies fever, chills  HEENT: denies cough or congestion  Respiratory: denies any shortness of breath  Cardiovascular: denies chest pain or palpitations  Gastrointestinal: + abdominal pain (improved), denied N/V Genitourinary: denies burning with urination or urinary frequency Musculoskeletal: denies pain, decreased motor or sensation Integumentary: + Jaundice   Vital signs in last 24 hours: [min-max] current  Temp:  [97.3 F (36.3 C)-97.7 F (36.5 C)] 97.7 F (36.5 C) (12/01 0553) Pulse Rate:  [62-78] 78 (12/01 0553) Resp:  [14-17] 16 (12/01 0553) BP: (95-121)/(52-70) 106/63 (12/01 0553) SpO2:  [99 %-100 %] 99 % (12/01 0553)     Height: 4\' 9"  (144.8 cm) Weight: 108.9 kg BMI (Calculated): 51.92   Intake/Output last 2 shifts:  11/30 0701 - 12/01 0700 In: 2520.2 [I.V.:2339.3; IV Piggyback:180.9] Out: -    Physical Exam:  Constitutional: alert, cooperative and no distress  HENT: normocephalic without obvious abnormality  Eyes: PERRL, EOM's grossly intact and symmetric, + scleral icterus  Respiratory: breathing non-labored at rest  Cardiovascular: regular rate and sinus rhythm  Gastrointestinal: Soft, non-tender, non-distended, no rebound/guarding Integumentary: +  Juandice, warm. dry   Labs:  CBC Latest Ref Rng & Units 06/28/2021 06/27/2021 06/26/2021  WBC 4.0 - 10.5 K/uL 10.0 13.2(H) 14.0(H)  Hemoglobin 12.0 - 15.0 g/dL 9.6(L) 9.5(L) 11.5(L)  Hematocrit 36.0 - 46.0 % 29.8(L) 29.2(L) 34.4(L)  Platelets 150 - 400 K/uL 429(H) 371 439(H)   CMP Latest Ref Rng & Units 06/28/2021 06/27/2021 06/26/2021  Glucose 70 - 99 mg/dL 73 133(H) 129(H)  BUN 8 - 23 mg/dL 15 11 7(L)  Creatinine 0.44 - 1.00 mg/dL 0.35(L) 0.35(L) <0.30(L)  Sodium 135 - 145 mmol/L 137 132(L) 130(L)  Potassium 3.5 - 5.1 mmol/L 4.1 3.7 3.3(L)  Chloride 98 - 111 mmol/L 110 104 99  CO2 22 - 32 mmol/L 22 25 24   Calcium 8.9 - 10.3 mg/dL 7.4(L) 7.7(L) 7.7(L)  Total Protein 6.5 - 8.1 g/dL 5.6(L) 6.2(L) -  Total Bilirubin 0.3 - 1.2 mg/dL 14.3(H) 18.0(H) -  Alkaline Phos 38 - 126 U/L 344(H) 386(H) -  AST 15 - 41 U/L 69(H) 84(H) -  ALT 0 - 44 U/L 36 37 -     Imaging studies: No new pertinent imaging studies   Assessment/Plan: (ICD-10's: K81.0) 70 y.o. female with leukocytosis, LFT elevation, and hyperbilirubinemia concerning for obstructing juandice secondary to pancreatic vs biliary malignancy with possible concomitant cholecystitis.    - I was able to discuss this patient's case with IR this morning. Agreeable with attempting transhepatic biliary drain today, unclear whether or not she has cholecystitis, so will likely hold off on percutaneous cholecystostomy tube. Greatly appreciate their help.    - Agree that it remains reasonable to attempt transfer to tertiary center given complexity of this case and lack of EUS for diagnosis at this facility  - Continue NPO for now for IR procedure;  may start CLD after - Continue IV Abx (Zosyn) - Monitor abdominal examination; on-going bowel function               - Pain control prn; antiemetics prn - Monitor hyperbilirubinemia; improved - Hold DVT prophylaxis              - Further management per primary service; we will follow    With the  help of Spanish-English medical interpretor, all of the above findings and recommendations were discussed with the patient, and all of patient's questions were answered to her expressed satisfaction.  -- Edison Simon, PA-C [facility] 06/28/2021, 7:00 AM [phone] M-F: 7am - 4pm

## 2021-06-29 DIAGNOSIS — K8309 Other cholangitis: Secondary | ICD-10-CM | POA: Diagnosis not present

## 2021-06-29 DIAGNOSIS — R7989 Other specified abnormal findings of blood chemistry: Secondary | ICD-10-CM | POA: Diagnosis not present

## 2021-06-29 DIAGNOSIS — Z515 Encounter for palliative care: Secondary | ICD-10-CM | POA: Diagnosis not present

## 2021-06-29 DIAGNOSIS — Z7189 Other specified counseling: Secondary | ICD-10-CM | POA: Diagnosis not present

## 2021-06-29 DIAGNOSIS — K8689 Other specified diseases of pancreas: Secondary | ICD-10-CM

## 2021-06-29 DIAGNOSIS — A419 Sepsis, unspecified organism: Secondary | ICD-10-CM | POA: Diagnosis not present

## 2021-06-29 DIAGNOSIS — D72829 Elevated white blood cell count, unspecified: Secondary | ICD-10-CM | POA: Diagnosis not present

## 2021-06-29 DIAGNOSIS — R6520 Severe sepsis without septic shock: Secondary | ICD-10-CM | POA: Diagnosis not present

## 2021-06-29 DIAGNOSIS — K831 Obstruction of bile duct: Secondary | ICD-10-CM | POA: Diagnosis not present

## 2021-06-29 DIAGNOSIS — K810 Acute cholecystitis: Secondary | ICD-10-CM | POA: Diagnosis not present

## 2021-06-29 DIAGNOSIS — A4151 Sepsis due to Escherichia coli [E. coli]: Secondary | ICD-10-CM | POA: Diagnosis not present

## 2021-06-29 LAB — COMPREHENSIVE METABOLIC PANEL
ALT: 35 U/L (ref 0–44)
AST: 69 U/L — ABNORMAL HIGH (ref 15–41)
Albumin: 1.6 g/dL — ABNORMAL LOW (ref 3.5–5.0)
Alkaline Phosphatase: 379 U/L — ABNORMAL HIGH (ref 38–126)
Anion gap: 4 — ABNORMAL LOW (ref 5–15)
BUN: 11 mg/dL (ref 8–23)
CO2: 21 mmol/L — ABNORMAL LOW (ref 22–32)
Calcium: 7.9 mg/dL — ABNORMAL LOW (ref 8.9–10.3)
Chloride: 112 mmol/L — ABNORMAL HIGH (ref 98–111)
Creatinine, Ser: 0.45 mg/dL (ref 0.44–1.00)
GFR, Estimated: >60 mL/min (ref >60)
Glucose, Bld: 140 mg/dL — ABNORMAL HIGH (ref 70–99)
Potassium: 4.3 mmol/L (ref 3.5–5.1)
Sodium: 137 mmol/L (ref 135–145)
Total Bilirubin: 11.2 mg/dL — ABNORMAL HIGH (ref 0.3–1.2)
Total Protein: 6.1 g/dL — ABNORMAL LOW (ref 6.5–8.1)

## 2021-06-29 LAB — CBC WITH DIFFERENTIAL/PLATELET
Abs Immature Granulocytes: 0.3 10*3/uL — ABNORMAL HIGH (ref 0.00–0.07)
Basophils Absolute: 0.1 10*3/uL (ref 0.0–0.1)
Basophils Relative: 0 %
Eosinophils Absolute: 0 10*3/uL (ref 0.0–0.5)
Eosinophils Relative: 0 %
HCT: 30 % — ABNORMAL LOW (ref 36.0–46.0)
Hemoglobin: 9.7 g/dL — ABNORMAL LOW (ref 12.0–15.0)
Immature Granulocytes: 2 %
Lymphocytes Relative: 3 %
Lymphs Abs: 0.4 10*3/uL — ABNORMAL LOW (ref 0.7–4.0)
MCH: 31 pg (ref 26.0–34.0)
MCHC: 32.3 g/dL (ref 30.0–36.0)
MCV: 95.8 fL (ref 80.0–100.0)
Monocytes Absolute: 0.5 10*3/uL (ref 0.1–1.0)
Monocytes Relative: 3 %
Neutro Abs: 15.5 10*3/uL — ABNORMAL HIGH (ref 1.7–7.7)
Neutrophils Relative %: 92 %
Platelets: 498 10*3/uL — ABNORMAL HIGH (ref 150–400)
RBC: 3.13 MIL/uL — ABNORMAL LOW (ref 3.87–5.11)
RDW: 17.3 % — ABNORMAL HIGH (ref 11.5–15.5)
WBC: 16.9 10*3/uL — ABNORMAL HIGH (ref 4.0–10.5)
nRBC: 0 % (ref 0.0–0.2)

## 2021-06-29 LAB — CULTURE, BLOOD (ROUTINE X 2)
Special Requests: ADEQUATE
Special Requests: ADEQUATE

## 2021-06-29 LAB — MAGNESIUM: Magnesium: 1.9 mg/dL (ref 1.7–2.4)

## 2021-06-29 MED ORDER — SODIUM CHLORIDE 0.9% FLUSH
5.0000 mL | Freq: Three times a day (TID) | INTRAVENOUS | Status: DC
  Administered 2021-06-29 – 2021-06-30 (×4): 5 mL

## 2021-06-29 MED ORDER — SODIUM CHLORIDE 0.9 % IV SOLN
2.0000 g | INTRAVENOUS | Status: DC
  Filled 2021-06-29: qty 20

## 2021-06-29 MED ORDER — SODIUM CHLORIDE 0.9 % IV SOLN
3.0000 g | Freq: Four times a day (QID) | INTRAVENOUS | Status: DC
  Administered 2021-06-29 – 2021-06-30 (×5): 3 g via INTRAVENOUS
  Filled 2021-06-29: qty 8
  Filled 2021-06-29: qty 3
  Filled 2021-06-29 (×5): qty 8
  Filled 2021-06-29: qty 3

## 2021-06-29 NOTE — Consult Note (Signed)
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 Roosevelt Locks  Date of visit: 06/29/2021    History of presenting illness-patient is a 70 year old Hispanic female with a past medical history significant for depression anxiety and hypertension who was admitted to the hospital for acute encephalopathy nausea vomiting and abdominal pain and concerns of obstructive jaundice.  She had a CT abdomen and pelvis with contrast which showed findings suspicious for acute gangrenous cholecystitis and evidence of ductal dilation with abrupt termination of the CBD in the region of pancreatic head.  Irregular wall thickening and interruption of gallbladder wall enhancement and areas of septation throughout the gallbladder.  Pericholecystic stranding and intrahepatic ductal dilation.  No visible suspicious lesion in the liver.  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  2 discrete areas of narrowing in the extrahepatic biliary tree just about the cystic duct confluence secondary to poorly enhancing areas of tumor and/or nodal disease.  Pancreatic duct obstruction adjacent to biliary obstruction with confluent soft tissue near the celiac suspicious for pancreatic primary.  This is favored though unusual presentation of biliary primary based on poorly enhancing  nodularity near the gallbladder.  Patient had ERCP On 06/26/2021 which showed major papula adjacent to diverticulum that appeared to be bulging.  Segmental biliary strictures in the middle third of the main bile duct.  Strictures were malignant appearing.  Biliary sphincterotomy was performed and a plastic stent was placed in the CBD.  History obtained with the help of Spanish interpreter.  There is no family at the bedside.  Patient reports that she has been feeling poorly for the last 2 months with intermittent fevers and abdominal pain.  Prior to that she was having a gradual decline over the last 3 to 4 weeks with poor appetite.  Presently she reports pain at the placement of the drain site.  ECOG PS- 3  Pain scale- 5   Review of systems- Review of Systems  Constitutional:  Positive for malaise/fatigue.  Gastrointestinal:  Positive for abdominal pain.   No Known Allergies  Patient Active Problem List   Diagnosis Date Noted   Gangrenous cholecystitis 06/27/2021   Hyponatremia 06/27/2021   Hypokalemia 06/27/2021   Severe sepsis ([place]) 06/27/2021   Obstructive jaundice 06/26/2021     History reviewed. No pertinent past medical history.   Past Surgical History:  Procedure Laterality Date   ERCP N/A 06/26/2021   Procedure: ENDOSCOPIC RETROGRADE CHOLANGIOPANCREATOGRAPHY (ERCP);  Surgeon: Lucilla Lame, MD;  Location: [facility] ENDOSCOPY;  Service: Endoscopy;  Laterality: N/A;   IR BILIARY DRAIN PLACEMENT WITH CHOLANGIOGRAM  06/28/2021    Social History   Socioeconomic History   Marital status: Single    Spouse name: Not on file   Number of children: Not on file   Years of education: Not on file   Highest education level: Not on file  Occupational History   Not on file  Tobacco Use  Smoking status: Unknown   Smokeless tobacco: Not on file  Vaping Use   Vaping Use: Not on file  Substance and Sexual Activity   Alcohol use: Not Currently   Drug use: Not on file   Sexual  activity: Not on file  Other Topics Concern   Not on file  Social History Narrative   Not on file   Social Determinants of Health   Financial Resource Strain: Not on file  Food Insecurity: Not on file  Transportation Needs: Not on file  Physical Activity: Not on file  Stress: Not on file  Social Connections: Not on file  Intimate Partner Violence: Not on file     History reviewed. No pertinent family history.   Current Facility-Administered Medications:    acetaminophen (TYLENOL) tablet 650 mg, 650 mg, Oral, Q6H PRN **OR** acetaminophen (TYLENOL) suppository 650 mg, 650 mg, Rectal, Q6H PRN, Mansy, Jan A, MD   Ampicillin-Sulbactam (UNASYN) 3 g in sodium chloride 0.9 % 100 mL IVPB, 3 g, Intravenous, Q6H, Zhang, Dekui, MD   dextrose 5 %-0.9 % sodium chloride infusion, , Intravenous, Continuous, Sharen Hones, MD, Last Rate: 50 mL/hr at 06/29/21 1424, Rate Change at 06/29/21 1424   HYDROmorphone (DILAUDID) injection 0.5 mg, 0.5 mg, Intravenous, Q4H PRN, Sharen Hones, MD, 0.5 mg at 06/29/21 1610   magnesium hydroxide (MILK OF MAGNESIA) suspension 30 mL, 30 mL, Oral, Daily PRN, Mansy, Jan A, MD   midodrine (PROAMATINE) tablet 5 mg, 5 mg, Oral, TID WC, Sharen Hones, MD, 5 mg at 06/29/21 1422   ondansetron (ZOFRAN) tablet 4 mg, 4 mg, Oral, Q6H PRN **OR** ondansetron (ZOFRAN) injection 4 mg, 4 mg, Intravenous, Q6H PRN, Mansy, Jan A, MD   sodium chloride flush (NS) 0.9 % injection 5 mL, 5 mL, Intracatheter, Q8H, Suttle, Dylan J, MD   traZODone (DESYREL) tablet 25 mg, 25 mg, Oral, QHS PRN, Mansy, Jan A, MD   Physical exam:  Vitals:   06/29/21 0752 06/29/21 0939 06/29/21 1131 06/29/21 1548  BP: (!) 91/31 (!) 112/55 121/67 114/61  Pulse: 79 75 78 86  Resp: 14  16 16   Temp: (!) 97.5 F (36.4 C)  98.5 F (36.9 C) 98.6 F (37 C)  TempSrc:      SpO2: 99% 99% 98% 100%  Weight:      Height:       Physical Exam Constitutional:      Comments: Generalized icterus  Cardiovascular:     Rate  and Rhythm: Normal rate and regular rhythm.     Heart sounds: Normal heart sounds.  Pulmonary:     Effort: Pulmonary effort is normal.     Breath sounds: Normal breath sounds.  Abdominal:     General: Bowel sounds are normal.     Palpations: Abdomen is soft.     Comments: PTCA in place  Skin:    General: Skin is warm and dry.  Neurological:     Mental Status: She is alert and oriented to person, place, and time.       CMP Latest Ref Rng & Units 06/29/2021  Glucose 70 - 99 mg/dL 140(H)  BUN 8 - 23 mg/dL 11  Creatinine 0.44 - 1.00 mg/dL 0.45  Sodium 135 - 145 mmol/L 137  Potassium 3.5 - 5.1 mmol/L 4.3  Chloride 98 - 111 mmol/L 112(H)  CO2 22 - 32 mmol/L 21(L)  Calcium 8.9 - 10.3 mg/dL 7.9(L)  Total Protein 6.5 - 8.1 g/dL 6.1(L)  Total Bilirubin 0.3 - 1.2 mg/dL 11.2(H)  Alkaline Phos 38 - 126 U/L 379(H)  AST 15 - 41 U/L 69(H)  ALT 0 - 44 U/L 35   CBC Latest Ref Rng & Units 06/29/2021  WBC 4.0 - 10.5 K/uL 16.9(H)  Hemoglobin 12.0 - 15.0 g/dL 9.7(L)  Hematocrit 36.0 - 46.0 % 30.0(L)  Platelets 150 - 400 K/uL 498(H)    @IMAGES @  CT HEAD WO CONTRAST (5MM)  Result Date: 06/26/2021 CLINICAL DATA:  Mental status change. EXAM: CT HEAD WITHOUT CONTRAST TECHNIQUE: Contiguous axial images were obtained from the base of the skull through the vertex without intravenous contrast. COMPARISON:  None. FINDINGS: Brain: No evidence of acute infarction, hemorrhage, hydrocephalus, extra-axial collection or mass lesion/mass effect. Prominence of the sulci noted. There is mild diffuse low-attenuation within the subcortical and periventricular white matter compatible with chronic microvascular disease. Vascular: No hyperdense vessel or unexpected calcification. Skull: Normal. Negative for fracture or focal lesion. Sinuses/Orbits: No acute finding. Other: None IMPRESSION: 1. No acute intracranial abnormalities. 2. Chronic small vessel ischemic change and brain atrophy. Electronically Signed   By:  Kerby Moors M.D.   On: 06/26/2021 10:54   CT ABDOMEN PELVIS W CONTRAST  Addendum Date: 06/26/2021   ADDENDUM REPORT: 06/26/2021 11:30 ADDENDUM: There is also soft tissue nodularity along the superior aspect of the gallbladder neck that is best seen on coronal image 47 of series 5 but also on axial image 29 of series 2 this measures approximately 13 mm. It is unclear whether this the constellation of findings represents biliary primary with metastasis or pancreatic primary with metastasis. Ductal obstruction occurs at the level of the pancreas rather than at this location. These results were called by telephone at the time of interpretation on 06/26/2021 at 11:29 am to provider Duffy Bruce , who verbally acknowledged these results. Electronically Signed   By: Zetta Bills M.D.   On: 06/26/2021 11:30   Result Date: 06/26/2021 CLINICAL DATA:  Abdominal pain, acute nonlocalized abdominal pain in a 70 year old female. EXAM: CT ABDOMEN AND PELVIS WITH CONTRAST TECHNIQUE: Multidetector CT imaging of the abdomen and pelvis was performed using the standard protocol following bolus administration of intravenous contrast. CONTRAST:  154mL OMNIPAQUE IOHEXOL 300 MG/ML  SOLN COMPARISON:  None available FINDINGS: Lower chest: Mild septal thickening and scarring at the lung bases. Cardiac enlargement with mitral annular calcifications and signs of coronary artery calcification. Hepatobiliary: Profound biliary duct dilation with abrupt termination of the common bile duct in the region of the pancreatic head. Irregular wall thickening with interruption of gallbladder wall enhancement and areas of septation throughout the gallbladder. Nodular area on image 29 of series 2 measuring 13 mm. Marked pericholecystic stranding and associated intrahepatic biliary duct dilation. No visible suspicious lesion in the liver on venous phase. The portal vein is mildly distorted at the level of the pancreas. The portal vein does  remain patent. Hepatic veins are patent. Potential fluid collection in the gallbladder fossa measuring up to 3.5 x 2.0 cm appears to communicate with the lumen of the gallbladder Pancreas: Abrupt termination of the pancreatic duct in the neck of the pancreas. Soft tissue posterior to the SMV (image 30/2) this is at the level of the splenic portal confluence and also the level of biliary duct obstruction. Small lymph nodes throughout the celiac no additional soft tissue or findings suggestive of frank adenopathy. Peripheral pancreatic duct dilation without substantial surrounding stranding. Spleen: Mild splenic enlargement. Adrenals/Urinary Tract: Adrenal glands are normal. There is symmetric renal enhancement. No hydronephrosis. No suspicious renal  lesion. Stomach/Bowel: Hiatal hernia.  Colonic diverticulosis. Vascular/Lymphatic: Distortion of the portal vein. Potential involvement of the common hepatic artery with stranding extending along the distal aspect of the celiac trunk as well. Atherosclerotic changes throughout the abdominal aorta. No adenopathy in the pelvis. Reproductive: Unremarkable by CT. Other: No ascites.  No signs of free air. Musculoskeletal: No acute bone finding. No destructive bone process. Spinal degenerative changes. 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 Soft tissue about the celiac with distortion of the portal vein associated with the site of biliary obstruction and adjacent to level of pancreatic duct obstruction. This may represent a combination of nodal disease and primary pancreatic neoplasm versus direct extension of pancreatic neoplasm. Distortion of the portal vein is best illustrated on sagittal images. Profound biliary duct dilation of both extrahepatic and intrahepatic biliary tree. Electronically Signed: By: Zetta Bills M.D. On:  06/26/2021 11:06   MR 3D Recon At Scanner  Result Date: 06/27/2021 CLINICAL DATA:  A 70 year old female presenting with jaundice with suspected pancreatic mass and biliary obstruction EXAM: MRI ABDOMEN WITHOUT AND WITH CONTRAST (INCLUDING MRCP) TECHNIQUE: Multiplanar multisequence MR imaging of the abdomen was performed both before and after the administration of intravenous contrast. Heavily T2-weighted images of the biliary and pancreatic ducts were obtained, and three-dimensional MRCP images were rendered by post processing. CONTRAST:  54mL GADAVIST GADOBUTROL 1 MMOL/ML IV SOLN COMPARISON:  Comparison is made with prior CT evaluation of June 26, 2021. FINDINGS: Lower chest: No effusion, no consolidative process. Limited assessment of the lung bases on MRI. Hepatobiliary: Marked intrahepatic biliary duct distension with similar appearance to previous imaging. Biliary stent has been placed since the previous exam traversing the area near the neck of the pancreas where there is ductal narrowing. Signs of pneumobilia post stent placement. No visible intrahepatic lesion. Associated with the superior wall the gallbladder is an area of intermediate T2 signal that also contacts the RIGHT hepatic artery and narrows the bile duct (image 10/15). This measures 16 x 11 mm. This shows enhancement. This corresponds to the enhancing area seen on the previous CT in this location. There is some debris in the distal common bile duct in the dependent aspect. Portal vein is patent. Irregular appearance of the gallbladder with numerous gallstones in the Cystic duct confluence with common hepatic duct appears to be involved at the more peripheral area of narrowing. Pancreas: Focal pancreatic duct dilation beginning just peripheral to the pancreatic neck (image 43/20) confluent hypointense tissue above the uncinate process posterior to the portal vein showing low level enhancement as well is posterior to the site of pancreatic  narrowingaw. There is focal T1 hypointensity in the region of the neck of the pancreas though this is quite subtle. Perhaps measuring up to 1.6 x 1.3 cm (image 53/20) corresponding to T1 hypointensity seen on series 17 in this location. Posterior to the pancreas and splenic portal confluence is a 2.2 x 1.6 cm area of poorly enhancing tissue suspected to represent either direct extension or celiac nodal metastasis with indistinct margins and associated with the site of biliary duct obstruction along the LEFT lateral margin of the common bile duct open image 45/22) Spleen:  Unremarkable. Adrenals/Urinary Tract:  No acute renal findings. Stomach/Bowel: Unremarkable to the extent evaluated on abdominal MRI. Vascular/Lymphatic: Distortion of the portal vein at the splenic portal confluence. Soft tissue about the common hepatic artery, RIGHT hepatic  artery and the distal celiac trunk. 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 Other:  No ascites. Musculoskeletal: No suspicious bone lesions identified. 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 No substantial change from recent imaging. Two discrete areas of narrowing in the extrahepatic biliary tree, just above the cystic duct confluence and at the level of the cystic duct confluence secondary to poorly enhancing areas of tumor and or nodal disease. Pancreatic duct obstruction adjacent to site of biliary obstruction with confluent soft tissue near the celiac suspicious for pancreatic primary. This is favored though unusual presentation for biliary primary is considered based on poorly enhancing nodularity near the gallbladder. Signs of potential vascular involvement as outlined on previous  imaging. Pancreatic protocol CT could be helpful. Ultimately EUS may be required for diagnosis. Post biliary stent placement with persistent marked biliary duct distension. Perhaps stent does not traverse both areas of ductal narrowing. There is however pneumobilia within the intrahepatic bile ducts. The stent is poorly evaluated on the current study. Correlate with ERCP results and intraprocedural data. These results will be called to the ordering clinician or representative by the Radiologist Assistant, and communication documented in the PACS or [employer]. Electronically Signed   By: Zetta Bills M.D.   On: 06/27/2021 16:28   DG Chest Port 1 View  Result Date: 06/26/2021 CLINICAL DATA:  Altered mental status.  Possible sepsis.  Jaundice. EXAM: PORTABLE CHEST 1 VIEW COMPARISON:  None. FINDINGS: The heart is borderline enlarged with a left ventricular configuration. Moderate tortuosity of the thoracic aorta. The lungs are clear. No infiltrates or effusions. No pulmonary lesions. The bony thorax is intact. IMPRESSION: No acute cardiopulmonary findings. Electronically Signed   By: Marijo Sanes M.D.   On: 06/26/2021 10:01   DG C-Arm 1-60 Min-No Report  Result Date: 06/26/2021 Fluoroscopy was utilized by the requesting physician.  No radiographic interpretation.   MR ABDOMEN MRCP W WO CONTAST  Result Date: 06/27/2021 CLINICAL DATA:  A 70 year old female presenting with jaundice with suspected pancreatic mass and biliary obstruction EXAM: MRI ABDOMEN WITHOUT AND WITH CONTRAST (INCLUDING MRCP) TECHNIQUE: Multiplanar multisequence MR imaging of the abdomen was performed both before and after the administration of intravenous contrast. Heavily T2-weighted images of the biliary and pancreatic ducts were obtained, and three-dimensional MRCP images were rendered by post processing. CONTRAST:  36mL GADAVIST GADOBUTROL 1 MMOL/ML IV SOLN COMPARISON:  Comparison is made with prior CT evaluation of  June 26, 2021. FINDINGS: Lower chest: No effusion, no consolidative process. Limited assessment of the lung bases on MRI. Hepatobiliary: Marked intrahepatic biliary duct distension with similar appearance to previous imaging. Biliary stent has been placed since the previous exam traversing the area near the neck of the pancreas where there is ductal narrowing. Signs of pneumobilia post stent placement. No visible intrahepatic lesion. Associated with the superior wall the gallbladder is an area of intermediate T2 signal that also contacts the RIGHT hepatic artery and narrows the bile duct (image 10/15). This measures 16 x 11 mm. This shows enhancement. This corresponds to the enhancing area seen on the previous CT in this location. There is some debris in the distal common bile duct in the dependent aspect. Portal vein is patent. Irregular appearance of the gallbladder with numerous gallstones in the Cystic duct confluence with  common hepatic duct appears to be involved at the more peripheral area of narrowing. Pancreas: Focal pancreatic duct dilation beginning just peripheral to the pancreatic neck (image 43/20) confluent hypointense tissue above the uncinate process posterior to the portal vein showing low level enhancement as well is posterior to the site of pancreatic narrowingaw. There is focal T1 hypointensity in the region of the neck of the pancreas though this is quite subtle. Perhaps measuring up to 1.6 x 1.3 cm (image 53/20) corresponding to T1 hypointensity seen on series 17 in this location. Posterior to the pancreas and splenic portal confluence is a 2.2 x 1.6 cm area of poorly enhancing tissue suspected to represent either direct extension or celiac nodal metastasis with indistinct margins and associated with the site of biliary duct obstruction along the LEFT lateral margin of the common bile duct open image 45/22) Spleen:  Unremarkable. Adrenals/Urinary Tract:  No acute renal findings.  Stomach/Bowel: Unremarkable to the extent evaluated on abdominal MRI. Vascular/Lymphatic: Distortion of the portal vein at the splenic portal confluence. Soft tissue about the common hepatic artery, RIGHT hepatic artery and the distal celiac trunk. 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 Other:  No ascites. Musculoskeletal: No suspicious bone lesions identified. 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 No substantial change from recent imaging. Two discrete areas of narrowing in the extrahepatic biliary tree, just above the cystic duct confluence and at the level of the cystic duct confluence secondary to poorly enhancing areas of tumor and or nodal disease. Pancreatic duct obstruction adjacent to site of biliary obstruction with confluent soft tissue near the celiac suspicious for pancreatic primary. This is favored though unusual presentation for biliary primary is considered based on poorly enhancing nodularity near the gallbladder. Signs of potential vascular involvement as outlined on previous imaging. Pancreatic protocol CT could be helpful. Ultimately EUS may be required for diagnosis. Post biliary stent placement with persistent marked biliary duct distension. Perhaps stent does not traverse both areas of ductal narrowing. There is however pneumobilia within the intrahepatic bile ducts. The stent is poorly evaluated on the current study. Correlate with ERCP results and intraprocedural data. These results will be called to the ordering clinician or representative by the Radiologist Assistant, and communication documented in the PACS or [employer]. Electronically Signed   By: Zetta Bills M.D.   On: 06/27/2021 16:28    IR BILIARY DRAIN PLACEMENT WITH CHOLANGIOGRAM  Result Date: 06/28/2021 INDICATION: 70 year old female with history of persistent biliary obstruction after endoscopically placed common bile duct stent with suggestion of hilar obstructive mass. EXAM: 1. Percutaneous cholangiogram. 2. Percutaneous transhepatic biliary drain placement. MEDICATIONS: The patient is currently receiving intravenous Zosyn as an inpatient. ANESTHESIA/SEDATION: Moderate (conscious) sedation was employed during this procedure. A total of Versed 2.5 mg and Fentanyl 125 mcg was administered intravenously by the radiology nurse. Total intra-service moderate Sedation Time: 74 minutes. The patient's level of consciousness and vital signs were monitored continuously by radiology nursing throughout the procedure under my direct supervision. FLUOROSCOPY TIME:  Fluoroscopy Time: 24 minutes 36 seconds (448.5 mGy). CONTRAST:  60 mL Omnipaque 867, biliary COMPLICATIONS: None immediate. PROCEDURE: Informed written consent was obtained from the patient after a thorough discussion of the procedural risks, benefits and alternatives. All questions were addressed. Maximal Sterile Barrier Technique was  utilized including caps, mask, sterile gowns, sterile gloves, sterile drape, hand hygiene and skin antiseptic. A timeout was performed prior to the initiation of the procedure. The right upper abdomen was prepped and draped in standard fashion. Preprocedure ultrasound evaluation demonstrated prominent bilateral bile ducts. A right-sided bile duct was initially targeted under ultrasound guidance. Subdermal Local anesthesia was provided 1% lidocaine at the planned needle entry site. Under direct ultrasound visualization, a 21 gauge needle was directed into a peripheral right biliary radicle. Gentle hand injection of contrast demonstrated intraluminal location within the biliary tree. A Nitrex wire was directed centrally within the biliary tree. A 6 French  Accustick set was then attempted to be inserted, however near the entry site of the bile duct, there is abrupt tortuosity and the coaxial sheath would not track over the wire. Therefore attempted left-sided biliary tree access was performed under ultrasound guidance. Subdermal Local anesthesia was provided with 1% lidocaine at the planned needle entry site. Under direct ultrasound visualization, a 21 gauge needle was directed into a peripheral left biliary radicle. Gentle hand injection of contrast demonstrated intraluminal location within the biliary tree, however Nitrex wire was unable to select the bile duct to tortuosity. Despite inability to cannulate the biliary tree, there was adequate opacification for fluoroscopic guided direct stick. Therefore an inferior, posterior right biliary radicle was targeted under fluoroscopic guidance with a 21 gauge micropuncture needle. Hand injection of contrast demonstrated intraluminal biliary position. A Nitrex wire was then inserted and directed with ease toward the central bile ducts. A small skin nick was made. A 6 French Accustick set was then introduced into the central biliary tree. Hand injection of contrast confirmed position. A Glidewire was then used to attempt to traverse the biliary confluence without success. Therefore, an Amplatz wire was inserted and the 6 [place] Accustick set was exchanged for a 7 [place], angled tip braided sheath, through which a 5 [place] Kumpe the catheter and Glidewire were inserted in attempt to traverse the biliary obstruction. This was also unsuccessful., therefore a 10 [place] multipurpose drain with the pigtail string cut was inserted into the right-sided biliary tree to serve as an external drain. The drain was affixed to the skin with a 0 silk suture. The drain was placed to bag drainage. A sterile bandage was applied. The patient tolerated the procedure well without immediate complication and was transferred back to the floor in  good condition. IMPRESSION: 1. Percutaneous cholangiogram demonstrates diffuse intrahepatic biliary ductal dilation with obstruction at the biliary confluence. 2. Technically successful placement of a right-sided 10.2 [place] external biliary drain. PLAN: Keep to bag drainage and continue to trend bilirubin levels. As indicated, additional attempt at internalization and possible brush biopsy could be performed by Interventional Radiology. Otherwise, plan to return in approximately 8 weeks for routine check and exchange. Ruthann Cancer, MD [facility] Radiology Electronically Signed   By: Ruthann Cancer M.D.   On: 06/28/2021 16:34   US Abdomen Limited RUQ (LIVER/GB)  Result Date: 06/26/2021 CLINICAL DATA:  Acute right upper quadrant abdominal pain. EXAM: ULTRASOUND ABDOMEN LIMITED RIGHT UPPER QUADRANT COMPARISON:  CT of same day. FINDINGS: Gallbladder: Cholelithiasis is noted with moderate gallbladder wall thickening measuring 5 mm. No sonographic Murphy's sign is noted. No pericholecystic fluid is noted. Common bile duct: Diameter: 13 mm which is dilated and concerning for distal common bile duct obstruction. Liver: No focal lesion identified. Within normal limits in parenchymal echogenicity. Intrahepatic biliary dilatation is noted. Portal vein is  patent on color Doppler imaging with normal direction of blood flow towards the liver. Other: None. IMPRESSION: Cholelithiasis is noted with moderate gallbladder wall thickening concerning for cholecystitis. Intrahepatic and extrahepatic biliary dilatation is noted concerning for distal common bile duct obstruction. Potentially this is due to pancreatic mass as suggested on prior CT scan. Electronically Signed   By: Marijo Conception M.D.   On: 06/26/2021 11:45    Assessment and plan- Patient is a 70 y.o. 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  However we do not have any brushings or tissue diagnosis to support the diagnosis.  Following PTCA her bilirubin has come down from 20 to 11.  I would recommend getting EUS as an outpatient for further diagnosis before deciding if she would ever be a surgical candidate.However in order to give any prognosis or considerations for best supportive care/hospice in the future I will need tissue diagnosis to confirm the diagnosis of malignancy especially given that there is no obvious mass that would be suggestive of cancer and predominantly we have seen multiple strictures.  Palliative care is also currently involved in goals of care discussions.   Her blood cultures from 06/26/2021 are also currently positive for E. coli.  I think presently the main issue is is the gallbladder gangrenous/infected or not and if it needs to be drained separately or surgically managed.  I would think what is keeping her in the hospital right now is her potential infection and not her malignancy.  Malignancy work-up can be pursued as an outpatient once stable from infection standpoint.    I could check tumor markers CA 19-9 and CEA but the concern is that they could be falsely elevated in the presence of possible cholecystitis and ongoing infection.    Visit Diagnosis 1. Cholangitis   2. RUQ pain   3. Pancreatic mass   4. Cholecystitis   5. Pancreatitis   6. Jaundice     Dr. Randa Evens, MD, MPH [facility] [phone] 06/29/2021

## 2021-06-29 NOTE — Evaluation (Signed)
Physical Therapy Evaluation Patient Details Name: Rachel Montoya MRN: [id number] DOB: 1951/07/22 Today's Date: 06/29/2021  History of Present Illness  Rachel Montoya is a 70yoF (Spanish speaking) who comes [facility] 11/29 c nausea, vomiting, RUQ/RLQ pain. DTR reports pt was NOT having AMS as read in [facility] ED notes. Pt admitted with leukocytosis, LFT elevation, and hyperbilirubinemia concerning for obstructing juandice secondary to pancreatic vs biliary malignancy with  possible concomitant cholecystitis but unlikely necrotizing infection. At baseline, pt lives with DTR locally, no frank mobility difficulty, but per DTR prefers to be homebound, does not like to go out. No DME use or falls history PTA.  Clinical Impression  Pt admitted with above diagnosis. Pt currently with functional limitations due to the deficits listed below (see "PT Problem List"). Upon entry, pt in bed, awake and agreeable to participate, DTR at bedside. The pt is alert, pleasant, interactive, and able to provide info regarding prior level of function, both in tolerance and independence. Pt generally slow and guarded with all movement, presentation in part to pain, as well as weakness and fatigue. BP improved on midodrine therapy, no orthostasis, no hypotension. Stuck to in room AMB this date due to frail presentation and mismatch with subjective report. Patient's performance this date reveals decreased ability, independence, and tolerance in performing all basic mobility required for performance of activities of daily living. Pt requires additional DME, close physical assistance, and cues for safe participate in mobility. Pt will benefit from skilled PT intervention to increase independence and safety with basic mobility in preparation for discharge to the venue listed below.         Recommendations for follow up therapy are one component of a multi-disciplinary discharge planning process, led by the attending physician.   Recommendations may be updated based on patient status, additional functional criteria and insurance authorization.  Follow Up Recommendations Home health PT    Assistance Recommended at Discharge Intermittent Supervision/Assistance  Functional Status Assessment Patient has had a recent decline in their functional status and demonstrates the ability to make significant improvements in function in a reasonable and predictable amount of time.  Equipment Recommendations  Rolling walker (2 wheels)    Recommendations for Other Services       Precautions / Restrictions Precautions Precautions: Fall Restrictions Weight Bearing Restrictions: No      Mobility  Bed Mobility Overal bed mobility: Needs Assistance Bed Mobility: Supine to Sit     Supine to sit: Supervision     General bed mobility comments: slow, weak appearing, no assist needed.    Transfers Overall transfer level: Needs assistance Equipment used: None;1 person hand held assist               General transfer comment: reluctant to stand without holding on (midodrine appears to be helping with hypotension issues, no orthostatis in session, no concerningly low MAP in session)    Ambulation/Gait Ambulation/Gait assistance: Min guard;Supervision   Assistive device: 1 person hand held assist;Rolling walker (2 wheels) Gait Pattern/deviations: Step-to pattern       General Gait Details: very slow, segmentted, appears unsure, no frank LOB or abnormal sway; has RUQ pain that clearly impacts ability to walk.  Stairs            Wheelchair Mobility    Modified Rankin (Stroke Patients Only)       Balance  Pertinent Vitals/Pain Pain Assessment: 0-10 Pain Score: 5  Pain Location: RUQ Pain Intervention(s): Limited activity within patient's tolerance    Home Living Family/patient expects to be discharged to:: Private residence Living  Arrangements: Alone (3 grandkids, 20, 15, 8yo) Available Help at Discharge: Family Earnest Bailey DTR lives nearby; Priddy works 3rd shift) Type of Home: House Home Access: Stairs to enter Entrance Stairs-Rails: Left (wiggly) Entrance Stairs-Number of Steps: 4   Home Layout: One level Home Equipment: None      Prior Function Prior Level of Function : Independent/Modified Independent             Mobility Comments: modI ADLs Comments: independent;     Hand Dominance   Dominant Hand: Right    Extremity/Trunk Assessment        Lower Extremity Assessment Lower Extremity Assessment:  (edematous BLE, worse than baseline)       Communication   Communication: Interpreter utilized  Cognition Arousal/Alertness: Awake/alert Behavior During Therapy: WFL for tasks assessed/performed Overall Cognitive Status: Within Functional Limits for tasks assessed                                          General Comments      Exercises     Assessment/Plan    PT Assessment Patient needs continued PT services  PT Problem List Decreased strength;Decreased activity tolerance;Decreased balance;Decreased mobility;Decreased cognition;Decreased knowledge of use of DME;Decreased safety awareness;Decreased knowledge of precautions       PT Treatment Interventions DME instruction;Gait training;Functional mobility training;Stair training;Therapeutic activities;Therapeutic exercise;Patient/family education;Balance training    PT Goals (Current goals can be found in the Care Plan section)  Acute Rehab PT Goals Patient Stated Goal: feel better for return to home PT Goal Formulation: With family Time For Goal Achievement: 07/13/21 Potential to Achieve Goals: Fair    Frequency Min 2X/week   Barriers to discharge        Co-evaluation               AM-PAC PT "6 Clicks" Mobility  Outcome Measure Help needed turning from your back to your side while in a flat bed  without using bedrails?: A Little Help needed moving from lying on your back to sitting on the side of a flat bed without using bedrails?: A Little Help needed moving to and from a bed to a chair (including a wheelchair)?: A Little Help needed standing up from a chair using your arms (e.g., wheelchair or bedside chair)?: A Little Help needed to walk in hospital room?: A Little Help needed climbing 3-5 steps with a railing? : A Lot 6 Click Score: 17    End of Session   Activity Tolerance: Patient tolerated treatment well;No increased pain;Patient limited by fatigue Patient left: with call bell/phone within reach;in chair;with family/visitor present Nurse Communication: Mobility status PT Visit Diagnosis: Unsteadiness on feet (R26.81);Difficulty in walking, not elsewhere classified (R26.2);Other abnormalities of gait and mobility (R26.89);Muscle weakness (generalized) (M62.81)    Time: 2130-8657 PT Time Calculation (min) (ACUTE ONLY): 38 min   Charges:   PT Evaluation $PT Eval Moderate Complexity: 1 Mod PT Treatments $Therapeutic Exercise: 8-22 mins       12:39 PM, 06/29/21 Etta Grandchild, PT, DPT Physical Therapist - [facility]  [phone] ([place])    Elysha Daw C 06/29/2021, 12:34 PM

## 2021-06-29 NOTE — Progress Notes (Signed)
PROGRESS NOTE    Rachel Montoya  HDQ:[id number] DOB: 1951/07/13 DOA: 06/26/2021 PCP: Pcp, No    Brief Narrative:  Rachel Montoya is a 70 y.o. Hispanic female with medical history significant for depression, anxiety and hypertension coming with altered mental status with confusion as well as nausea and vomiting since 11/23 with associated jaundice, chills and right upper quadrant abdominal pain. Patient has a bilirubin of 18.  CT abdomen/pelvis showed acute gangrenous cholecystitis, biliary duct dilatation with possible obstruction at the level of pancreas.  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 Patient had a ERCP performed 11/29 by Dr. Allen Norris, due to multiple biliary obstructions, it is uncertain whether this obstruction has been resolved.  Patient also pending for cholecystostomy.  And followed by general surgery. Transhepatic bilary drain was performed 12/1. Patient will need outpatient EUS which can be arranged by RN. Mariea Clonts.    Assessment & Plan:   Principal Problem:   Obstructive jaundice Active Problems:   Gangrenous cholecystitis   Hyponatremia   Hypokalemia   Severe sepsis (HCC)  Obstructive jaundice secondary to pancreatic/biliary mass. Gangrenous cholecystitis. Severe sepsis. Patient is status post ERCP and transhepatic biliary tract drain. Blood culture came back with E. coli.  Antibiotic changed to Rocephin 2 g daily.  Repeat blood culture. Had an in-depth discussion with Dr. Dahlia Byes, Dr. Marius Ditch.  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  Impossible to perform cholecystostomy or cholecystectomy.  As for the diagnosis of primary cancer, patient will need an EUS biopsy as not an urgent procedure.  Oncology could not provide a prognosis until pathology is available. At this point, patient condition appears to be terminal.  Will obtain consult from palliative care. Daughters are not available today to discuss.  I  will meet her tomorrow. Still no bed offer from [facility].  Hypokalemia. Hyponatremia. Improving, full liquid diet started per general surgery.  I will reduce fluids.  Morbid obesity. Severe hypoalbuminemia.   DVT prophylaxis: SCDs Code Status: full Family Communication: Daughter updated. Disposition Plan:      Status is: Inpatient   Remains inpatient appropriate because: Severity of the disease, IV antibiotics       I/O last 3 completed shifts: In: 2631.5 [I.V.:2381.5; IV Piggyback:250] Out: 74 [Urine:700; Drains:250] Total I/O In: 0  Out: 100 [Drains:100]     Consultants:  GI, general surgery, interventional radiology.Oncology  Procedures:   Antimicrobials: Ceftriaxone.  Subjective: Patient had some pain yesterday after procedure.  Pain is better today. She is started on full liquid diet.  No nausea vomiting. Denies any short of breath or cough. No fever or chills. No dysuria hematuria.  Objective: Vitals:   06/29/21 0424 06/29/21 0752 06/29/21 0939 06/29/21 1131  BP: 117/68 (!) 91/31 (!) 112/55 121/67  Pulse: 77 79 75 78  Resp: 18 14  16   Temp: 97.8 F (36.6 C) (!) 97.5 F (36.4 C)  98.5 F (36.9 C)  TempSrc:      SpO2: 100% 99% 99% 98%  Weight:      Height:        Intake/Output Summary (Last 24 hours) at 06/29/2021 1305 Last data filed at 06/29/2021 0849 Gross per 24 hour  Intake 1102.8 ml  Output 1050 ml  Net 52.8 ml   Filed Weights   06/26/21 0904  Weight: 108.9 kg    Examination:  General exam: Appears calm and comfortable  Respiratory system: Clear to auscultation. Respiratory effort normal. Cardiovascular system: S1 & S2 heard,  RRR. No JVD, murmurs, rubs, gallops or clicks. No pedal edema. Gastrointestinal system: Abdomen is nondistended, soft and nontender. No organomegaly or masses felt. Normal bowel sounds heard. Central nervous system: Alert and oriented. No focal neurological deficits. Extremities: Symmetric 5 x 5  power. Skin: No rashes, lesions or ulcers Psychiatry:  Mood & affect appropriate.     Data Reviewed: I have personally reviewed following labs and imaging studies  CBC: Recent Labs  Lab 06/26/21 0858 06/27/21 0215 06/28/21 0441 06/29/21 0128  WBC 14.0* 13.2* 10.0 16.9*  NEUTROABS 12.0*  --  7.8* 15.5*  HGB 11.5* 9.5* 9.6* 9.7*  HCT 34.4* 29.2* 29.8* 30.0*  MCV 92.0 92.1 94.0 95.8  PLT 439* 371 429* 664*   Basic Metabolic Panel: Recent Labs  Lab 06/26/21 0858 06/26/21 1550 06/27/21 0215 06/28/21 0441 06/29/21 0128  NA 129* 130* 132* 137 137  K 2.7* 3.3* 3.7 4.1 4.3  CL 91* 99 104 110 112*  CO2 27 24 25 22  21*  GLUCOSE 117* 129* 133* 73 140*  BUN 10 7* 11 15 11   CREATININE <0.30* <0.30* 0.35* 0.35* 0.45  CALCIUM 8.5* 7.7* 7.7* 7.4* 7.9*  MG 1.9  --   --  1.9 1.9  PHOS  --   --   --  UNABLE TO REPORT DUE TO ICTERUS  --    GFR: Estimated Creatinine Clearance: 66.9 mL/min (by C-G formula based on SCr of 0.45 mg/dL). Liver Function Tests: Recent Labs  Lab 06/26/21 0858 06/27/21 0215 06/28/21 0441 06/29/21 0128  AST 105* 84* 69* 69*  ALT 47* 37 36 35  ALKPHOS 521* 386* 344* 379*  BILITOT 20.9* 18.0* 14.3* 11.2*  PROT 7.4 6.2* 5.6* 6.1*  ALBUMIN 2.0* 1.6* 1.5* 1.6*   No results for input(s): LIPASE, AMYLASE in the last 168 hours. Recent Labs  Lab 06/26/21 0858  AMMONIA 47*   Coagulation Profile: Recent Labs  Lab 06/26/21 0858 06/27/21 0215  INR 1.3* 1.5*   Cardiac Enzymes: No results for input(s): CKTOTAL, CKMB, CKMBINDEX, TROPONINI in the last 168 hours. BNP (last 3 results) No results for input(s): PROBNP in the last 8760 hours. HbA1C: No results for input(s): HGBA1C in the last 72 hours. CBG: No results for input(s): GLUCAP in the last 168 hours. Lipid Profile: No results for input(s): CHOL, HDL, LDLCALC, TRIG, CHOLHDL, LDLDIRECT in the last 72 hours. Thyroid Function Tests: No results for input(s): TSH, T4TOTAL, FREET4, T3FREE, THYROIDAB in  the last 72 hours. Anemia Panel: No results for input(s): VITAMINB12, FOLATE, FERRITIN, TIBC, IRON, RETICCTPCT in the last 72 hours. Sepsis Labs: Recent Labs  Lab 06/26/21 0858 06/26/21 2118 06/26/21 2253 06/27/21 0215  PROCALCITON  --   --   --  1.29  LATICACIDVEN 1.7 1.2 1.1  --     Recent Results (from the past 240 hour(s))  Blood Culture (routine x 2)     Status: Abnormal   Collection Time: 06/26/21  8:58 AM   Specimen: BLOOD  Result Value Ref Range Status   Specimen Description   Final    BLOOD BLOOD LEFT ARM Performed at [facility], [address], [place], [place] [address]    Special Requests   Final    BOTTLES DRAWN AEROBIC AND ANAEROBIC Blood Culture adequate volume Performed at [facility], [address] [place], [place] [address]    Culture  Setup Time   Final    ANAEROBIC BOTTLE ONLY GRAM NEGATIVE RODS CRITICAL VALUE NOTED.  VALUE IS CONSISTENT WITH  PREVIOUSLY REPORTED AND CALLED VALUE. AEROBIC BOTTLE ONLY GRAM POSITIVE COCCI Performed at [facility], [address] [place], [place] [address]    Culture (A)  Final    ESCHERICHIA COLI SUSCEPTIBILITIES PERFORMED ON PREVIOUS CULTURE WITHIN THE LAST 5 DAYS. STAPHYLOCOCCUS EPIDERMIDIS THE SIGNIFICANCE OF ISOLATING THIS ORGANISM FROM A SINGLE SET OF BLOOD CULTURES WHEN MULTIPLE SETS ARE DRAWN IS UNCERTAIN. PLEASE NOTIFY THE MICROBIOLOGY DEPARTMENT WITHIN ONE WEEK IF SPECIATION AND SENSITIVITIES ARE REQUIRED. Performed at [facility], [place] [address] [place], [place] [address]    Report Status 06/29/2021 FINAL  Final  Resp Panel by RT-PCR (Flu A&B, Covid) Nasopharyngeal Swab     Status: None   Collection Time: 06/26/21  8:58 AM   Specimen: Nasopharyngeal Swab; Nasopharyngeal(NP) swabs in vial transport medium  Result Value Ref Range Status   SARS Coronavirus 2 by RT PCR NEGATIVE NEGATIVE Final    Comment: (NOTE) SARS-CoV-2 target nucleic acids are NOT DETECTED.  The  SARS-CoV-2 RNA is generally detectable in upper respiratory specimens during the acute phase of infection. The lowest concentration of SARS-CoV-2 viral copies this assay can detect is 138 copies/mL. A negative result does not preclude SARS-Cov-2 infection and should not be used as the sole basis for treatment or other patient management decisions. A negative result may occur with  improper specimen collection/handling, submission of specimen other than nasopharyngeal swab, presence of viral mutation(s) within the areas targeted by this assay, and inadequate number of viral copies(<138 copies/mL). A negative result must be combined with clinical observations, patient history, and epidemiological information. The expected result is Negative.  Fact Sheet for Patients:  EntrepreneurPulse.com.au  Fact Sheet for Healthcare Providers:  IncredibleEmployment.be  This test is no t yet approved or cleared by the [place] FDA and  has been authorized for detection and/or diagnosis of SARS-CoV-2 by FDA under an Emergency Use Authorization (EUA). This EUA will remain  in effect (meaning this test can be used) for the duration of the COVID-19 declaration under Section 564(b)(1) of the Act, 21 U.S.C.section 360bbb-3(b)(1), unless the authorization is terminated  or revoked sooner.       Influenza A by PCR NEGATIVE NEGATIVE Final   Influenza B by PCR NEGATIVE NEGATIVE Final    Comment: (NOTE) The Xpert Xpress SARS-CoV-2/FLU/RSV plus assay is intended as an aid in the diagnosis of influenza from Nasopharyngeal swab specimens and should not be used as a sole basis for treatment. Nasal washings and aspirates are unacceptable for Xpert Xpress SARS-CoV-2/FLU/RSV testing.  Fact Sheet for Patients: EntrepreneurPulse.com.au  Fact Sheet for Healthcare Providers: IncredibleEmployment.be  This test is not yet approved or  cleared by the [place] FDA and has been authorized for detection and/or diagnosis of SARS-CoV-2 by FDA under an Emergency Use Authorization (EUA). This EUA will remain in effect (meaning this test can be used) for the duration of the COVID-19 declaration under Section 564(b)(1) of the Act, 21 U.S.C. section 360bbb-3(b)(1), unless the authorization is terminated or revoked.  Performed at [facility], [address] [place], [place] [address]   Blood Culture ID Panel (Reflexed)     Status: Abnormal   Collection Time: 06/26/21  8:58 AM  Result Value Ref Range Status   Enterococcus faecalis NOT DETECTED NOT DETECTED Final   Enterococcus Faecium NOT DETECTED NOT DETECTED Final   Listeria monocytogenes NOT DETECTED NOT DETECTED Final   Staphylococcus species DETECTED (A) NOT DETECTED Final    Comment: CRITICAL RESULT CALLED TO, READ  BACK BY AND VERIFIED WITH: NATHAN BELUE PHARMD 0422 06/27/21 HNM    Staphylococcus aureus (BCID) NOT DETECTED NOT DETECTED Final   Staphylococcus epidermidis DETECTED (A) NOT DETECTED Final    Comment: CRITICAL RESULT CALLED TO, READ BACK BY AND VERIFIED WITH: NATHAN BELUE PHARMD 0422 06/27/21 HNM    Staphylococcus lugdunensis NOT DETECTED NOT DETECTED Final   Streptococcus species NOT DETECTED NOT DETECTED Final   Streptococcus agalactiae NOT DETECTED NOT DETECTED Final   Streptococcus pneumoniae NOT DETECTED NOT DETECTED Final   Streptococcus pyogenes NOT DETECTED NOT DETECTED Final   A.calcoaceticus-baumannii NOT DETECTED NOT DETECTED Final   Bacteroides fragilis NOT DETECTED NOT DETECTED Final   Enterobacterales NOT DETECTED NOT DETECTED Final   Enterobacter cloacae complex NOT DETECTED NOT DETECTED Final   Escherichia coli NOT DETECTED NOT DETECTED Final   Klebsiella aerogenes NOT DETECTED NOT DETECTED Final   Klebsiella oxytoca NOT DETECTED NOT DETECTED Final   Klebsiella pneumoniae NOT DETECTED NOT DETECTED Final   Proteus species  NOT DETECTED NOT DETECTED Final   Salmonella species NOT DETECTED NOT DETECTED Final   Serratia marcescens NOT DETECTED NOT DETECTED Final   Haemophilus influenzae NOT DETECTED NOT DETECTED Final   Neisseria meningitidis NOT DETECTED NOT DETECTED Final   Pseudomonas aeruginosa NOT DETECTED NOT DETECTED Final   Stenotrophomonas maltophilia NOT DETECTED NOT DETECTED Final   Candida albicans NOT DETECTED NOT DETECTED Final   Candida auris NOT DETECTED NOT DETECTED Final   Candida glabrata NOT DETECTED NOT DETECTED Final   Candida krusei NOT DETECTED NOT DETECTED Final   Candida parapsilosis NOT DETECTED NOT DETECTED Final   Candida tropicalis NOT DETECTED NOT DETECTED Final   Cryptococcus neoformans/gattii NOT DETECTED NOT DETECTED Final   Methicillin resistance mecA/C NOT DETECTED NOT DETECTED Final    Comment: Performed at [facility], [address] [place], [place] [address]  Blood Culture (routine x 2)     Status: Abnormal   Collection Time: 06/26/21  8:59 AM   Specimen: BLOOD  Result Value Ref Range Status   Specimen Description   Final    BLOOD RIGHT ANTECUBITAL Performed at [facility], [address], [place], [place] [address]    Special Requests   Final    BOTTLES DRAWN AEROBIC AND ANAEROBIC Blood Culture adequate volume Performed at [facility], [address] [place], [place] [address]    Culture  Setup Time   Final    GRAM NEGATIVE RODS IN BOTH AEROBIC AND ANAEROBIC BOTTLES CRITICAL RESULT CALLED TO, READ BACK BY AND VERIFIED WITH: BRENNAN BEERS @ 2153 ON 06/26/21.Marland KitchenMarland KitchenTKR Performed at [facility], [place] [address], [place], [place] [address]    Culture ESCHERICHIA COLI (A)  Final   Report Status 06/29/2021 FINAL  Final   Organism ID, Bacteria ESCHERICHIA COLI  Final      Susceptibility   Escherichia coli - MIC*    AMPICILLIN <=2 SENSITIVE Sensitive     CEFAZOLIN <=4 SENSITIVE Sensitive     CEFEPIME <=0.12 SENSITIVE Sensitive      CEFTAZIDIME <=1 SENSITIVE Sensitive     CEFTRIAXONE <=0.25 SENSITIVE Sensitive     CIPROFLOXACIN <=0.25 SENSITIVE Sensitive     GENTAMICIN <=1 SENSITIVE Sensitive     IMIPENEM <=0.25 SENSITIVE Sensitive     TRIMETH/SULFA <=20 SENSITIVE Sensitive     AMPICILLIN/SULBACTAM <=2 SENSITIVE Sensitive     PIP/TAZO <=4 SENSITIVE Sensitive     * ESCHERICHIA COLI  Blood Culture ID Panel (Reflexed)  Status: Abnormal   Collection Time: 06/26/21  8:59 AM  Result Value Ref Range Status   Enterococcus faecalis NOT DETECTED NOT DETECTED Final   Enterococcus Faecium NOT DETECTED NOT DETECTED Final   Listeria monocytogenes NOT DETECTED NOT DETECTED Final   Staphylococcus species NOT DETECTED NOT DETECTED Final   Staphylococcus aureus (BCID) NOT DETECTED NOT DETECTED Final   Staphylococcus epidermidis NOT DETECTED NOT DETECTED Final   Staphylococcus lugdunensis NOT DETECTED NOT DETECTED Final   Streptococcus species NOT DETECTED NOT DETECTED Final   Streptococcus agalactiae NOT DETECTED NOT DETECTED Final   Streptococcus pneumoniae NOT DETECTED NOT DETECTED Final   Streptococcus pyogenes NOT DETECTED NOT DETECTED Final   A.calcoaceticus-baumannii NOT DETECTED NOT DETECTED Final   Bacteroides fragilis NOT DETECTED NOT DETECTED Final   Enterobacterales DETECTED (A) NOT DETECTED Final    Comment: Enterobacterales represent a large order of gram negative bacteria, not a single organism. CRITICAL RESULT CALLED TO, READ BACK BY AND VERIFIED WITH: BRENNAN BEERS @ 2152 ON 06/26/21.Marland KitchenMarland KitchenTKR    Enterobacter cloacae complex NOT DETECTED NOT DETECTED Final   Escherichia coli DETECTED (A) NOT DETECTED Final    Comment: CRITICAL RESULT CALLED TO, READ BACK BY AND VERIFIED WITH: BRENNAN BEERS @ 2152 ON 06/26/21.Marland KitchenMarland KitchenTKR    Klebsiella aerogenes NOT DETECTED NOT DETECTED Final   Klebsiella oxytoca NOT DETECTED NOT DETECTED Final   Klebsiella pneumoniae NOT DETECTED NOT DETECTED Final   Proteus species NOT DETECTED  NOT DETECTED Final   Salmonella species NOT DETECTED NOT DETECTED Final   Serratia marcescens NOT DETECTED NOT DETECTED Final   Haemophilus influenzae NOT DETECTED NOT DETECTED Final   Neisseria meningitidis NOT DETECTED NOT DETECTED Final   Pseudomonas aeruginosa NOT DETECTED NOT DETECTED Final   Stenotrophomonas maltophilia NOT DETECTED NOT DETECTED Final   Candida albicans NOT DETECTED NOT DETECTED Final   Candida auris NOT DETECTED NOT DETECTED Final   Candida glabrata NOT DETECTED NOT DETECTED Final   Candida krusei NOT DETECTED NOT DETECTED Final   Candida parapsilosis NOT DETECTED NOT DETECTED Final   Candida tropicalis NOT DETECTED NOT DETECTED Final   Cryptococcus neoformans/gattii NOT DETECTED NOT DETECTED Final   CTX-M ESBL NOT DETECTED NOT DETECTED Final   Carbapenem resistance IMP NOT DETECTED NOT DETECTED Final   Carbapenem resistance KPC NOT DETECTED NOT DETECTED Final   Carbapenem resistance NDM NOT DETECTED NOT DETECTED Final   Carbapenem resist OXA 48 LIKE NOT DETECTED NOT DETECTED Final   Carbapenem resistance VIM NOT DETECTED NOT DETECTED Final    Comment: Performed at [facility], [address], [place], [place] [address]         Radiology Studies: MR 3D Recon At Scanner  Result Date: 06/27/2021 CLINICAL DATA:  A 70 year old female presenting with jaundice with suspected pancreatic mass and biliary obstruction EXAM: MRI ABDOMEN WITHOUT AND WITH CONTRAST (INCLUDING MRCP) TECHNIQUE: Multiplanar multisequence MR imaging of the abdomen was performed both before and after the administration of intravenous contrast. Heavily T2-weighted images of the biliary and pancreatic ducts were obtained, and three-dimensional MRCP images were rendered by post processing. CONTRAST:  45mL GADAVIST GADOBUTROL 1 MMOL/ML IV SOLN COMPARISON:  Comparison is made with prior CT evaluation of June 26, 2021. FINDINGS: Lower chest: No effusion, no consolidative process. Limited  assessment of the lung bases on MRI. Hepatobiliary: Marked intrahepatic biliary duct distension with similar appearance to previous imaging. Biliary stent has been placed since the previous exam traversing the area near the neck of the pancreas where there  is ductal narrowing. Signs of pneumobilia post stent placement. No visible intrahepatic lesion. Associated with the superior wall the gallbladder is an area of intermediate T2 signal that also contacts the RIGHT hepatic artery and narrows the bile duct (image 10/15). This measures 16 x 11 mm. This shows enhancement. This corresponds to the enhancing area seen on the previous CT in this location. There is some debris in the distal common bile duct in the dependent aspect. Portal vein is patent. Irregular appearance of the gallbladder with numerous gallstones in the Cystic duct confluence with common hepatic duct appears to be involved at the more peripheral area of narrowing. Pancreas: Focal pancreatic duct dilation beginning just peripheral to the pancreatic neck (image 43/20) confluent hypointense tissue above the uncinate process posterior to the portal vein showing low level enhancement as well is posterior to the site of pancreatic narrowingaw. There is focal T1 hypointensity in the region of the neck of the pancreas though this is quite subtle. Perhaps measuring up to 1.6 x 1.3 cm (image 53/20) corresponding to T1 hypointensity seen on series 17 in this location. Posterior to the pancreas and splenic portal confluence is a 2.2 x 1.6 cm area of poorly enhancing tissue suspected to represent either direct extension or celiac nodal metastasis with indistinct margins and associated with the site of biliary duct obstruction along the LEFT lateral margin of the common bile duct open image 45/22) Spleen:  Unremarkable. Adrenals/Urinary Tract:  No acute renal findings. Stomach/Bowel: Unremarkable to the extent evaluated on abdominal MRI. Vascular/Lymphatic:  Distortion of the portal vein at the splenic portal confluence. Soft tissue about the common hepatic artery, RIGHT hepatic artery and the distal celiac trunk. 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 Other:  No ascites. Musculoskeletal: No suspicious bone lesions identified. 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 No substantial change from recent imaging. Two discrete areas of narrowing in the extrahepatic biliary tree, just above the cystic duct confluence and at the level of the cystic duct confluence secondary to poorly enhancing areas of tumor and or nodal disease. Pancreatic duct obstruction adjacent to site of biliary obstruction with confluent soft tissue near the celiac suspicious for pancreatic primary. This is favored though unusual presentation for biliary primary is considered based on poorly enhancing nodularity near the gallbladder. Signs of potential vascular involvement as outlined on previous imaging. Pancreatic protocol CT could be helpful. Ultimately EUS may be required for diagnosis. Post biliary stent placement with persistent marked biliary duct distension. Perhaps stent does not traverse both areas of ductal narrowing. There is however pneumobilia within the intrahepatic bile ducts. The stent is poorly evaluated on the current study. Correlate with ERCP results and intraprocedural data. These results will be called to the ordering clinician or representative by the Radiologist Assistant, and communication documented in the PACS or [employer]. Electronically Signed   By: Zetta Bills M.D.   On: 06/27/2021 16:28   MR ABDOMEN MRCP W WO CONTAST  Result Date: 06/27/2021 CLINICAL DATA:  A 70 year old  female presenting with jaundice with suspected pancreatic mass and biliary obstruction EXAM: MRI ABDOMEN WITHOUT AND WITH CONTRAST (INCLUDING MRCP) TECHNIQUE: Multiplanar multisequence MR imaging of the abdomen was performed both before and after the administration of intravenous contrast. Heavily T2-weighted images of the biliary and  pancreatic ducts were obtained, and three-dimensional MRCP images were rendered by post processing. CONTRAST:  54mL GADAVIST GADOBUTROL 1 MMOL/ML IV SOLN COMPARISON:  Comparison is made with prior CT evaluation of June 26, 2021. FINDINGS: Lower chest: No effusion, no consolidative process. Limited assessment of the lung bases on MRI. Hepatobiliary: Marked intrahepatic biliary duct distension with similar appearance to previous imaging. Biliary stent has been placed since the previous exam traversing the area near the neck of the pancreas where there is ductal narrowing. Signs of pneumobilia post stent placement. No visible intrahepatic lesion. Associated with the superior wall the gallbladder is an area of intermediate T2 signal that also contacts the RIGHT hepatic artery and narrows the bile duct (image 10/15). This measures 16 x 11 mm. This shows enhancement. This corresponds to the enhancing area seen on the previous CT in this location. There is some debris in the distal common bile duct in the dependent aspect. Portal vein is patent. Irregular appearance of the gallbladder with numerous gallstones in the Cystic duct confluence with common hepatic duct appears to be involved at the more peripheral area of narrowing. Pancreas: Focal pancreatic duct dilation beginning just peripheral to the pancreatic neck (image 43/20) confluent hypointense tissue above the uncinate process posterior to the portal vein showing low level enhancement as well is posterior to the site of pancreatic narrowingaw. There is focal T1 hypointensity in the region of the neck of the pancreas though this is  quite subtle. Perhaps measuring up to 1.6 x 1.3 cm (image 53/20) corresponding to T1 hypointensity seen on series 17 in this location. Posterior to the pancreas and splenic portal confluence is a 2.2 x 1.6 cm area of poorly enhancing tissue suspected to represent either direct extension or celiac nodal metastasis with indistinct margins and associated with the site of biliary duct obstruction along the LEFT lateral margin of the common bile duct open image 45/22) Spleen:  Unremarkable. Adrenals/Urinary Tract:  No acute renal findings. Stomach/Bowel: Unremarkable to the extent evaluated on abdominal MRI. Vascular/Lymphatic: Distortion of the portal vein at the splenic portal confluence. Soft tissue about the common hepatic artery, RIGHT hepatic artery and the distal celiac trunk. 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 Other:  No ascites. Musculoskeletal: No suspicious bone lesions identified. 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 No substantial change from recent imaging. Two discrete areas of narrowing in the extrahepatic biliary tree, just above the cystic duct confluence and at the level of the cystic duct confluence secondary to poorly enhancing areas of tumor and or nodal disease. Pancreatic duct obstruction adjacent to site of biliary obstruction with confluent soft tissue near the celiac suspicious for pancreatic primary. This is favored though unusual presentation for biliary primary is considered based on poorly enhancing nodularity near the gallbladder. Signs of potential vascular involvement as outlined on previous imaging. Pancreatic protocol CT could be helpful. Ultimately EUS may be required for diagnosis. Post  biliary stent placement with persistent marked biliary duct distension. Perhaps stent does not traverse both areas of ductal narrowing. There is however pneumobilia within the intrahepatic bile ducts. The stent is poorly evaluated on the current study. Correlate with ERCP results and intraprocedural data. These results will be called to the ordering clinician or representative by the Radiologist Assistant, and communication documented  in the PACS or [employer]. Electronically Signed   By: Zetta Bills M.D.   On: 06/27/2021 16:28   IR BILIARY DRAIN PLACEMENT WITH CHOLANGIOGRAM  Result Date: 06/28/2021 INDICATION: 70 year old female with history of persistent biliary obstruction after endoscopically placed common bile duct stent with suggestion of hilar obstructive mass. EXAM: 1. Percutaneous cholangiogram. 2. Percutaneous transhepatic biliary drain placement. MEDICATIONS: The patient is currently receiving intravenous Zosyn as an inpatient. ANESTHESIA/SEDATION: Moderate (conscious) sedation was employed during this procedure. A total of Versed 2.5 mg and Fentanyl 125 mcg was administered intravenously by the radiology nurse. Total intra-service moderate Sedation Time: 74 minutes. The patient's level of consciousness and vital signs were monitored continuously by radiology nursing throughout the procedure under my direct supervision. FLUOROSCOPY TIME:  Fluoroscopy Time: 24 minutes 36 seconds (448.5 mGy). CONTRAST:  60 mL Omnipaque 161, biliary COMPLICATIONS: None immediate. PROCEDURE: Informed written consent was obtained from the patient after a thorough discussion of the procedural risks, benefits and alternatives. All questions were addressed. Maximal Sterile Barrier Technique was utilized including caps, mask, sterile gowns, sterile gloves, sterile drape, hand hygiene and skin antiseptic. A timeout was performed prior to the initiation of the procedure. The right upper abdomen was prepped and draped in  standard fashion. Preprocedure ultrasound evaluation demonstrated prominent bilateral bile ducts. A right-sided bile duct was initially targeted under ultrasound guidance. Subdermal Local anesthesia was provided 1% lidocaine at the planned needle entry site. Under direct ultrasound visualization, a 21 gauge needle was directed into a peripheral right biliary radicle. Gentle hand injection of contrast demonstrated intraluminal location within the biliary tree. A Nitrex wire was directed centrally within the biliary tree. A 6 French Accustick set was then attempted to be inserted, however near the entry site of the bile duct, there is abrupt tortuosity and the coaxial sheath would not track over the wire. Therefore attempted left-sided biliary tree access was performed under ultrasound guidance. Subdermal Local anesthesia was provided with 1% lidocaine at the planned needle entry site. Under direct ultrasound visualization, a 21 gauge needle was directed into a peripheral left biliary radicle. Gentle hand injection of contrast demonstrated intraluminal location within the biliary tree, however Nitrex wire was unable to select the bile duct to tortuosity. Despite inability to cannulate the biliary tree, there was adequate opacification for fluoroscopic guided direct stick. Therefore an inferior, posterior right biliary radicle was targeted under fluoroscopic guidance with a 21 gauge micropuncture needle. Hand injection of contrast demonstrated intraluminal biliary position. A Nitrex wire was then inserted and directed with ease toward the central bile ducts. A small skin nick was made. A 6 French Accustick set was then introduced into the central biliary tree. Hand injection of contrast confirmed position. A Glidewire was then used to attempt to traverse the biliary confluence without success. Therefore, an Amplatz wire was inserted and the 6 [place] Accustick set was exchanged for a 7 [place], angled tip braided sheath,  through which a 5 [place] Kumpe the catheter and Glidewire were inserted in attempt to traverse the biliary obstruction. This was also unsuccessful., therefore a 10 [place] multipurpose drain with the pigtail string cut was inserted into the right-sided biliary tree to serve as an external drain. The drain was affixed to the skin with a 0 silk suture. The drain was placed to bag drainage. A sterile bandage was applied. The patient tolerated the procedure well without immediate complication and was transferred back to the floor in good condition. IMPRESSION: 1. Percutaneous cholangiogram demonstrates diffuse  intrahepatic biliary ductal dilation with obstruction at the biliary confluence. 2. Technically successful placement of a right-sided 10.2 [place] external biliary drain. PLAN: Keep to bag drainage and continue to trend bilirubin levels. As indicated, additional attempt at internalization and possible brush biopsy could be performed by Interventional Radiology. Otherwise, plan to return in approximately 8 weeks for routine check and exchange. Ruthann Cancer, MD [facility] Radiology Electronically Signed   By: Ruthann Cancer M.D.   On: 06/28/2021 16:34        Scheduled Meds:  midodrine  5 mg Oral TID WC   Continuous Infusions:  cefTRIAXone (ROCEPHIN)  IV     dextrose 5 % and 0.9% NaCl 75 mL/hr at 06/29/21 0648     LOS: 3 days    Time spent: 35 minutes    Sharen Hones, MD [facility]   To contact the attending provider between 7A-7P or the covering provider during after hours 7P-7A, please log into the web site [email or web address] and access using universal [place] password for that web site. If you do not have the password, please call the hospital operator.  06/29/2021, 1:05 PM

## 2021-06-29 NOTE — Consult Note (Signed)
Consultation Note Date: 06/29/2021   Patient Name: Rachel Montoya  DOB: 09-26-1950  MRN: [id number]  Age / Sex: 70 y.o., female  PCP: Pcp, No Referring Physician: Sharen Hones, MD  Reason for Consultation: Establishing goals of care  HPI/Patient Profile: 70 y.o. female  with past medical history of depression, anxiety and hypertension admitted on 06/26/2021 with AMS, N/V, jaundice and RUQ abdominal pain. CT abdomen/pelvis showed acute gangrenous cholecystitis, biliary duct dilatation with possible obstruction at the level of pancreas. 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 Patient not a candidate for surgical intervention. Patient had ERCP 11/29 but there were multiple biliary obstructions - unclear of obstruction resolved. Had biliary drain placed 12/1. Blood cultures were positive with E. Coli. PMT consulted to discuss [place].   Clinical Assessment and Goals of Care: I have reviewed medical records including EPIC notes, labs and imaging, assessed the patient and then met with patient to discuss diagnosis prognosis, GOC, EOL wishes, disposition and options.  Spanish interpreter used throughout my visit to ensure understanding. Please note, throughout visit patient explained to me that she had become forgetful and was not remembering everything the doctors were telling her. Though she was oriented and we reviewed her condition again I am not sure she understands extent of her illness. Unfortunately, her daughters and unavailable today and were unable to be present during our discussion. Per Dr. Delories Heinz note, plans to meet with them tomorrow.   I introduced Palliative Medicine as specialized medical care for people living with serious illness. It focuses on providing relief from the symptoms and stress of a serious illness. The goal is to improve quality of life for both the patient and the  family.  Patient tells me she has had a decline at home and has been unable to care for herself. She tells me she has had poor appetite and become quite forgetful. She tells me of excessive fatigue - drinking coffee throughout the day to try to stay awake.    We discussed patient's current illness and what it means in the larger context of patient's on-going co-morbidities.  We discuss malignancy - gallbladder vs pancreatic. Initially she seems to not understand but then tells me she does remember the doctors telling her about a tumor. We discuss that the cancer is metastatic. We discuss that per surgery, cancer is unlikely to be resectable. We also discuss concern about her ability to tolerate chemo. She tells me she does not want surgery but then follows that by saying she will do whatever is recommended.   I attempted to elicit values and goals of care important to the patient.    The difference between aggressive medical intervention and comfort care was considered in light of the patient's goals of care.   Encouraged patient to consider DNR/DNI status understanding evidenced based poor outcomes in similar hospitalized patients, as the cause of the arrest is likely associated with chronic/terminal disease rather than a reversible acute cardio-pulmonary event. Patient tells me she would want full code interventions - I share my concern that this will not fix her cancer and may cause more harm. She expresses understanding but tells me that she trusts "God's will" and that when it is "her time" she will die. She also shares that if she survived a resuscitation attempt she would want to remain on the ventilator. She requested that I document this in her chart. I share with her that this topic will likely come up again and  other members of the medical team may recommend reconsidering code status. She expresses understanding.    Discussed with patient the importance of continued conversation with family  and the medical providers regarding overall plan of care and treatment options, ensuring decisions are within the context of the patient's values and GOCs.    Questions and concerns were addressed. The family was encouraged to call with questions or concerns.   Primary Decision Maker Patient? She is oriented but does tell me she is forgetful and has forgotten some of the medical information given to her however when I share information with her she seems to understand though I am not sure she understands severity of her illness   SUMMARY OF RECOMMENDATIONS   - I think moving forward involving daughters in conversations will be helpful - at this time patient is interested in full scope/full code - per note, Dr. Roosevelt Locks plans to speak to family over weekend - PMT to follow up early next week - requested nurse administer pain medication, patient experiencing ongoing abdominal pain  Code Status/Advance Care Planning: Full code     Primary Diagnoses: Present on Admission:  Obstructive jaundice   I have reviewed the medical record, interviewed the patient and family, and examined the patient. The following aspects are pertinent.  History reviewed. No pertinent past medical history. Social History   Socioeconomic History   Marital status: Single    Spouse name: Not on file   Number of children: Not on file   Years of education: Not on file   Highest education level: Not on file  Occupational History   Not on file  Tobacco Use   Smoking status: Unknown   Smokeless tobacco: Not on file  Vaping Use   Vaping Use: Not on file  Substance and Sexual Activity   Alcohol use: Not Currently   Drug use: Not on file   Sexual activity: Not on file  Other Topics Concern   Not on file  Social History Narrative   Not on file   Social Determinants of Health   Financial Resource Strain: Not on file  Food Insecurity: Not on file  Transportation Needs: Not on file  Physical Activity: Not on  file  Stress: Not on file  Social Connections: Not on file   History reviewed. No pertinent family history. Scheduled Meds:  midodrine  5 mg Oral TID WC   Continuous Infusions:  ampicillin-sulbactam (UNASYN) IV     dextrose 5 % and 0.9% NaCl 50 mL/hr at 06/29/21 1424   PRN Meds:.acetaminophen **OR** acetaminophen, HYDROmorphone (DILAUDID) injection, magnesium hydroxide, ondansetron **OR** ondansetron (ZOFRAN) IV, traZODone No Known Allergies Review of Systems  Gastrointestinal:  Positive for abdominal pain.   Physical Exam Constitutional:      General: She is not in acute distress. Pulmonary:     Effort: Pulmonary effort is normal.  Skin:    Coloration: Skin is jaundiced.  Neurological:     Mental Status: She is alert and oriented to person, place, and time.  Psychiatric:        Mood and Affect: Mood normal.        Behavior: Behavior normal.    Vital Signs: BP 121/67 (BP Location: Right Arm)   Pulse 78   Temp 98.5 F (36.9 C)   Resp 16   Ht $R'4\' 9"'Ho$  (1.448 m)   Wt 108.9 kg   SpO2 98%   BMI 51.94 kg/m  Pain Scale: 0-10   Pain Score: 6  SpO2: SpO2: 98 % O2 Device:SpO2: 98 % O2 Flow Rate: .O2 Flow Rate (L/min): 2 L/min  IO: Intake/output summary:  Intake/Output Summary (Last 24 hours) at 06/29/2021 1509 Last data filed at 06/29/2021 0849 Gross per 24 hour  Intake 1102.8 ml  Output 1050 ml  Net 52.8 ml    LBM: Last BM Date: 06/26/21 Baseline Weight: Weight: 108.9 kg Most recent weight: Weight: 108.9 kg     Palliative Assessment/Data: PPS 50%    Time Total: 80 minutes Greater than 50%  of this time was spent counseling and coordinating care related to the above assessment and plan.  Juel Burrow, DNP, AGNP-C Palliative Medicine Team [phone] Pager: [phone]

## 2021-06-29 NOTE — Progress Notes (Signed)
PT Cancellation Note  Patient Details Name: Rachel Montoya MRN: [id number] DOB: 12-Jun-1951   Cancelled Treatment:    Reason Eval/Treat Not Completed: Medical issues which prohibited therapy (Per chart, recent vitals revealing of MAP 40mmHg.) A MAP ? 60 mmHg is believed to be needed to maintain adequate tissue perfusion per current literature. Will attempt evaluation again later date/time as medically appropriate.   9:37 AM, 06/29/21 Etta Grandchild, PT, DPT Physical Therapist - [facility]  [phone] ([place])    Kamera Dubas C 06/29/2021, 9:36 AM

## 2021-06-29 NOTE — Progress Notes (Addendum)
CC: obstructive jaundice  Subjective: HAd Transhepatic drain. 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 Bili trending down She feels ok, Very weak Wbc up likely reactive  Objective: Vital signs in last 24 hours: Temp:  [97.5 F (36.4 C)-98.5 F (36.9 C)] 98.5 F (36.9 C) (12/02 1131) Pulse Rate:  [69-103] 78 (12/02 1131) Resp:  [14-20] 16 (12/02 1131) BP: (91-121)/(31-68) 121/67 (12/02 1131) SpO2:  [98 %-100 %] 98 % (12/02 1131) Last BM Date: 06/26/21  Intake/Output from previous day: 12/01 0701 - 12/02 0700 In: 1102.8 [I.V.:967.5; IV Piggyback:135.3] Out: 950 [Urine:700; Drains:250] Intake/Output this shift: Total I/O In: 0  Out: 100 [Drains:100]  Physical exam: Chronically ill still jaundice  Constitutional: alert, cooperative and no distress  HENT: normocephalic without obvious abnormality  Eyes: PERRL, EOM's grossly intact and symmetric, + scleral icterus  Respiratory: breathing non-labored at rest  Cardiovascular: regular rate and sinus rhythm  Gastrointestinal: Soft, non-tender, non-distended, no rebound/guarding. Biliary drain on the right side w green bile. No peritontitis Integumentary: + Juandice, warm. Dry Ext: pedal edema   Lab Results: CBC  Recent Labs    06/28/21 0441 06/29/21 0128  WBC 10.0 16.9*  HGB 9.6* 9.7*  HCT 29.8* 30.0*  PLT 429* 498*   BMET Recent Labs    06/28/21 0441 06/29/21 0128  NA 137 137  K 4.1 4.3  CL 110 112*  CO2 22 21*  GLUCOSE 73 140*  BUN 15 11  CREATININE 0.35* 0.45  CALCIUM 7.4* 7.9*   PT/INR Recent Labs    06/27/21 0215  LABPROT 18.1*  INR 1.5*   ABG No results for input(s): PHART, HCO3 in the last 72 hours.  Invalid input(s): PCO2, PO2  Studies/Results: MR 3D Recon At Scanner  Result Date: 06/27/2021 CLINICAL DATA:  A 70 year old female presenting with jaundice with suspected pancreatic mass and biliary obstruction EXAM: MRI ABDOMEN WITHOUT AND WITH CONTRAST (INCLUDING MRCP) TECHNIQUE:  Multiplanar multisequence MR imaging of the abdomen was performed both before and after the administration of intravenous contrast. Heavily T2-weighted images of the biliary and pancreatic ducts were obtained, and three-dimensional MRCP images were rendered by post processing. CONTRAST:  34mL GADAVIST GADOBUTROL 1 MMOL/ML IV SOLN COMPARISON:  Comparison is made with prior CT evaluation of June 26, 2021. FINDINGS: Lower chest: No effusion, no consolidative process. Limited assessment of the lung bases on MRI. Hepatobiliary: Marked intrahepatic biliary duct distension with similar appearance to previous imaging. Biliary stent has been placed since the previous exam traversing the area near the neck of the pancreas where there is ductal narrowing. Signs of pneumobilia post stent placement. No visible intrahepatic lesion. Associated with the superior wall the gallbladder is an area of intermediate T2 signal that also contacts the RIGHT hepatic artery and narrows the bile duct (image 10/15). This measures 16 x 11 mm. This shows enhancement. This corresponds to the enhancing area seen on the previous CT in this location. There is some debris in the distal common bile duct in the dependent aspect. Portal vein is patent. Irregular appearance of the gallbladder with numerous gallstones in the Cystic duct confluence with common hepatic duct appears to be involved at the more peripheral area of narrowing. Pancreas: Focal pancreatic duct dilation beginning just peripheral to the pancreatic neck (image 43/20) confluent hypointense tissue above the uncinate process posterior to the portal vein showing low level enhancement as well is posterior to the site of pancreatic narrowingaw. There is focal T1 hypointensity in the region of the neck of the pancreas though  this is quite subtle. Perhaps measuring up to 1.6 x 1.3 cm (image 53/20) corresponding to T1 hypointensity seen on series 17 in this location. Posterior to the  pancreas and splenic portal confluence is a 2.2 x 1.6 cm area of poorly enhancing tissue suspected to represent either direct extension or celiac nodal metastasis with indistinct margins and associated with the site of biliary duct obstruction along the LEFT lateral margin of the common bile duct open image 45/22) Spleen:  Unremarkable. Adrenals/Urinary Tract:  No acute renal findings. Stomach/Bowel: Unremarkable to the extent evaluated on abdominal MRI. Vascular/Lymphatic: Distortion of the portal vein at the splenic portal confluence. Soft tissue about the common hepatic artery, RIGHT hepatic artery and the distal celiac trunk. 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 Other:  No ascites. Musculoskeletal: No suspicious bone lesions identified. 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 No substantial change from recent imaging. Two discrete areas of narrowing in the extrahepatic biliary tree, just above the cystic duct confluence and at the level of the cystic duct confluence secondary to poorly enhancing areas of tumor and or nodal disease. Pancreatic duct obstruction adjacent to site of biliary obstruction with confluent soft tissue near the celiac suspicious for pancreatic primary. This is favored though unusual presentation for biliary primary is considered based on poorly enhancing nodularity near the gallbladder. Signs of potential vascular involvement as outlined on previous imaging. Pancreatic protocol CT could be helpful. Ultimately EUS may be required for diagnosis. Post biliary stent placement with persistent marked biliary duct distension. Perhaps stent does not traverse both areas of ductal narrowing. There is however  pneumobilia within the intrahepatic bile ducts. The stent is poorly evaluated on the current study. Correlate with ERCP results and intraprocedural data. These results will be called to the ordering clinician or representative by the Radiologist Assistant, and communication documented in the PACS or [employer]. Electronically Signed   By: Zetta Bills M.D.   On: 06/27/2021 16:28   MR ABDOMEN MRCP W WO CONTAST  Result Date: 06/27/2021 CLINICAL DATA:  A 70 year old female presenting with jaundice with suspected pancreatic mass and biliary obstruction EXAM: MRI ABDOMEN WITHOUT AND WITH CONTRAST (INCLUDING MRCP) TECHNIQUE: Multiplanar multisequence MR imaging of the abdomen was performed both before and after the administration of intravenous contrast. Heavily T2-weighted images of the biliary and pancreatic ducts were obtained, and three-dimensional MRCP images were rendered by post processing. CONTRAST:  3mL GADAVIST GADOBUTROL 1 MMOL/ML IV SOLN COMPARISON:  Comparison is made with prior CT evaluation of June 26, 2021. FINDINGS: Lower chest: No effusion, no consolidative process. Limited assessment of the lung bases on MRI. Hepatobiliary: Marked intrahepatic biliary duct distension with similar appearance to previous imaging. Biliary stent has been placed since the previous exam traversing the area near the neck of the pancreas where there is ductal narrowing. Signs of pneumobilia post stent placement. No visible intrahepatic lesion. Associated with the superior wall the gallbladder is an area of intermediate T2 signal that also contacts the RIGHT hepatic artery and narrows the bile duct (image 10/15). This measures 16 x 11 mm. This shows enhancement. This corresponds to the enhancing area seen on the previous CT in this location. There is some debris in the distal common bile duct in the dependent aspect. Portal vein is patent.  Irregular appearance of the gallbladder with numerous gallstones in the  Cystic duct confluence with common hepatic duct appears to be involved at the more peripheral area of narrowing. Pancreas: Focal pancreatic duct dilation beginning just peripheral to the pancreatic neck (image 43/20) confluent hypointense tissue above the uncinate process posterior to the portal vein showing low level enhancement as well is posterior to the site of pancreatic narrowingaw. There is focal T1 hypointensity in the region of the neck of the pancreas though this is quite subtle. Perhaps measuring up to 1.6 x 1.3 cm (image 53/20) corresponding to T1 hypointensity seen on series 17 in this location. Posterior to the pancreas and splenic portal confluence is a 2.2 x 1.6 cm area of poorly enhancing tissue suspected to represent either direct extension or celiac nodal metastasis with indistinct margins and associated with the site of biliary duct obstruction along the LEFT lateral margin of the common bile duct open image 45/22) Spleen:  Unremarkable. Adrenals/Urinary Tract:  No acute renal findings. Stomach/Bowel: Unremarkable to the extent evaluated on abdominal MRI. Vascular/Lymphatic: Distortion of the portal vein at the splenic portal confluence. Soft tissue about the common hepatic artery, RIGHT hepatic artery and the distal celiac trunk. 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 Other:  No ascites. Musculoskeletal: No suspicious bone lesions identified. 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 No substantial change from recent imaging. Two discrete areas of narrowing in the extrahepatic biliary tree, just above the cystic duct confluence and at the level of the cystic duct confluence secondary to poorly enhancing areas of  tumor and or nodal disease. Pancreatic duct obstruction adjacent to site of biliary obstruction with confluent soft tissue near the celiac suspicious for pancreatic primary. This is favored though unusual presentation for biliary primary is considered based on poorly enhancing nodularity near the gallbladder. Signs of potential vascular involvement as outlined on previous imaging. Pancreatic protocol CT could be helpful. Ultimately EUS may be required for diagnosis. Post biliary stent placement with persistent marked biliary duct distension. Perhaps stent does not traverse both areas of ductal narrowing. There is however pneumobilia within the intrahepatic bile ducts. The stent is poorly evaluated on the current study. Correlate with ERCP results and intraprocedural data. These results will be called to the ordering clinician or representative by the Radiologist Assistant, and communication documented in the PACS or [employer]. Electronically Signed   By: Zetta Bills M.D.   On: 06/27/2021 16:28   IR BILIARY DRAIN PLACEMENT WITH CHOLANGIOGRAM  Result Date: 06/28/2021 INDICATION: 70 year old female with history of persistent biliary obstruction after endoscopically placed common bile duct stent with suggestion of hilar obstructive mass. EXAM: 1. Percutaneous cholangiogram. 2. Percutaneous transhepatic biliary drain placement. MEDICATIONS: The patient is currently receiving intravenous Zosyn as an inpatient. ANESTHESIA/SEDATION: Moderate (conscious) sedation was employed during this procedure. A total of Versed 2.5 mg and Fentanyl 125 mcg was administered intravenously by the radiology nurse. Total intra-service moderate Sedation Time: 74 minutes. The patient's level of consciousness and vital signs were monitored continuously by radiology nursing throughout the procedure under my direct supervision. FLUOROSCOPY TIME:  Fluoroscopy Time: 24 minutes 36 seconds (448.5 mGy). CONTRAST:  60 mL Omnipaque 518,  biliary COMPLICATIONS: None immediate. PROCEDURE: Informed written consent was obtained from the patient after a thorough discussion of  the procedural risks, benefits and alternatives. All questions were addressed. Maximal Sterile Barrier Technique was utilized including caps, mask, sterile gowns, sterile gloves, sterile drape, hand hygiene and skin antiseptic. A timeout was performed prior to the initiation of the procedure. The right upper abdomen was prepped and draped in standard fashion. Preprocedure ultrasound evaluation demonstrated prominent bilateral bile ducts. A right-sided bile duct was initially targeted under ultrasound guidance. Subdermal Local anesthesia was provided 1% lidocaine at the planned needle entry site. Under direct ultrasound visualization, a 21 gauge needle was directed into a peripheral right biliary radicle. Gentle hand injection of contrast demonstrated intraluminal location within the biliary tree. A Nitrex wire was directed centrally within the biliary tree. A 6 French Accustick set was then attempted to be inserted, however near the entry site of the bile duct, there is abrupt tortuosity and the coaxial sheath would not track over the wire. Therefore attempted left-sided biliary tree access was performed under ultrasound guidance. Subdermal Local anesthesia was provided with 1% lidocaine at the planned needle entry site. Under direct ultrasound visualization, a 21 gauge needle was directed into a peripheral left biliary radicle. Gentle hand injection of contrast demonstrated intraluminal location within the biliary tree, however Nitrex wire was unable to select the bile duct to tortuosity. Despite inability to cannulate the biliary tree, there was adequate opacification for fluoroscopic guided direct stick. Therefore an inferior, posterior right biliary radicle was targeted under fluoroscopic guidance with a 21 gauge micropuncture needle. Hand injection of contrast demonstrated  intraluminal biliary position. A Nitrex wire was then inserted and directed with ease toward the central bile ducts. A small skin nick was made. A 6 French Accustick set was then introduced into the central biliary tree. Hand injection of contrast confirmed position. A Glidewire was then used to attempt to traverse the biliary confluence without success. Therefore, an Amplatz wire was inserted and the 6 [place] Accustick set was exchanged for a 7 [place], angled tip braided sheath, through which a 5 [place] Kumpe the catheter and Glidewire were inserted in attempt to traverse the biliary obstruction. This was also unsuccessful., therefore a 10 [place] multipurpose drain with the pigtail string cut was inserted into the right-sided biliary tree to serve as an external drain. The drain was affixed to the skin with a 0 silk suture. The drain was placed to bag drainage. A sterile bandage was applied. The patient tolerated the procedure well without immediate complication and was transferred back to the floor in good condition. IMPRESSION: 1. Percutaneous cholangiogram demonstrates diffuse intrahepatic biliary ductal dilation with obstruction at the biliary confluence. 2. Technically successful placement of a right-sided 10.2 [place] external biliary drain. PLAN: Keep to bag drainage and continue to trend bilirubin levels. As indicated, additional attempt at internalization and possible brush biopsy could be performed by Interventional Radiology. Otherwise, plan to return in approximately 8 weeks for routine check and exchange. Ruthann Cancer, MD [facility]iology Electronically Signed   By: Ruthann Cancer M.D.   On: 06/28/2021 16:34    Anti-infectives: Anti-infectives (From admission, onward)    Start     Dose/Rate Route Frequency Ordered Stop   06/29/21 1400  cefTRIAXone (ROCEPHIN) 2 g in sodium chloride 0.9 % 100 mL IVPB        2 g 200 mL/hr over 30 Minutes  Intravenous Every 24 hours 06/29/21 1129     06/27/21 1500  vancomycin (VANCOCIN) IVPB 1000 mg/200 mL premix  Status:  Discontinued  1,000 mg 200 mL/hr over 60 Minutes Intravenous Every 24 hours 06/26/21 1444 06/27/21 1006   06/27/21 1400  piperacillin-tazobactam (ZOSYN) IVPB 3.375 g  Status:  Discontinued        3.375 g 12.5 mL/hr over 240 Minutes Intravenous Every 8 hours 06/27/21 1006 06/29/21 1129   06/26/21 1600  ceFEPIme (MAXIPIME) 2 g in sodium chloride 0.9 % 100 mL IVPB  Status:  Discontinued        2 g 200 mL/hr over 30 Minutes Intravenous  Once 06/26/21 1344 06/26/21 1437   06/26/21 1600  ceFEPIme (MAXIPIME) 2 g in sodium chloride 0.9 % 100 mL IVPB  Status:  Discontinued        2 g 200 mL/hr over 30 Minutes Intravenous Every 8 hours 06/26/21 1437 06/27/21 1006   06/26/21 1500  ceFEPIme (MAXIPIME) 2 g in sodium chloride 0.9 % 100 mL IVPB  Status:  Discontinued        2 g 200 mL/hr over 30 Minutes Intravenous  Once 06/26/21 1325 06/26/21 1344   06/26/21 1500  vancomycin (VANCOREADY) IVPB 1500 mg/300 mL       See Hyperspace for full Linked Orders Report.   1,500 mg 150 mL/hr over 120 Minutes Intravenous  Once 06/26/21 1352 06/26/21 1734   06/26/21 1400  metroNIDAZOLE (FLAGYL) IVPB 500 mg  Status:  Discontinued        500 mg 100 mL/hr over 60 Minutes Intravenous Every 12 hours 06/26/21 1325 06/27/21 1006   06/26/21 1400  vancomycin (VANCOCIN) IVPB 1000 mg/200 mL premix       See Hyperspace for full Linked Orders Report.   1,000 mg 200 mL/hr over 60 Minutes Intravenous  Once 06/26/21 1352 06/26/21 1537   06/26/21 1330  vancomycin (VANCOCIN) IVPB 1000 mg/200 mL premix  Status:  Discontinued        1,000 mg 200 mL/hr over 60 Minutes Intravenous  Once 06/26/21 1325 06/26/21 1350   06/26/21 1000  piperacillin-tazobactam (ZOSYN) IVPB 3.375 g        3.375 g 100 mL/hr over 30 Minutes Intravenous  Once 06/26/21 0958 06/26/21 1059       Assessment/Plan: 70 y.o. female with  leukocytosis, LFT elevation, and hyperbilirubinemia concerning for obstructing juandice secondary to pancreatic vs biliary malignancy with  possible concomitant cholecystitis but unlikely necrotizing infection.  No surgical intervention  Next step is  EUS for tissue diagnosis, likely palliative care. May do this as outpt once her condition stabilizes and she is able to be managed as outpt. Unlikely that she will be able to tolerate any chemo and unlikely to be resectable given extension. - Continue IV Abx  - Further management per primary service. - No surgical issues at this time  D/W Dr. Roosevelt Locks in detail Time spent: 40 minutes, more than 50% time involving direct patient care.  Caroleen Hamman, MD, [facility]  06/29/2021

## 2021-06-29 NOTE — Progress Notes (Signed)
On call provider texted via secure chat at 2300 "Pt has says that she has been trying to void all day and has been unable to.  Pt bladder scanned for 691 mls and still says that she can't void.  Please advise, thanks."  Provider ordered to in and out cath.  Pt cathed at 0130 for 700 mls.

## 2021-06-29 NOTE — TOC Progression Note (Signed)
Transition of Care [facility]) - Progression Note    Patient Details  Name: Rachel Montoya MRN: [id number] Date of Birth: [birth year]  Transition of Care [facility]) CM/SW [place], RN Phone Number: 06/29/2021, 9:20 AM  Clinical Narrative:   TOC continues to follow for DC planning and needs, The patient remains on the waiting list to transfer to [facility], Drain was placed yesterday, Had in and out Cath done yesterday for inability to void. Plan remains unclear, Treatment continues, TOC will continue to follow for needs         Expected Discharge Plan and Services                                                 Social Determinants of Health (SDOH) Interventions    Readmission Risk Interventions No flowsheet data found.

## 2021-06-30 ENCOUNTER — Ambulatory Visit (HOSPITAL_COMMUNITY)
Admission: AD | Admit: 2021-06-30 | Discharge: 2021-06-30 | Disposition: A | Discharge status: Home / Self Care | Payer: Medicare Other | Source: Other Acute Inpatient Hospital | Attending: Internal Medicine | Admitting: Internal Medicine

## 2021-06-30 DIAGNOSIS — K810 Acute cholecystitis: Secondary | ICD-10-CM | POA: Diagnosis not present

## 2021-06-30 DIAGNOSIS — C259 Malignant neoplasm of pancreas, unspecified: Secondary | ICD-10-CM | POA: Diagnosis not present

## 2021-06-30 DIAGNOSIS — K831 Obstruction of bile duct: Secondary | ICD-10-CM | POA: Diagnosis not present

## 2021-06-30 DIAGNOSIS — R17 Unspecified jaundice: Secondary | ICD-10-CM | POA: Diagnosis present

## 2021-06-30 DIAGNOSIS — E8809 Other disorders of plasma-protein metabolism, not elsewhere classified: Secondary | ICD-10-CM

## 2021-06-30 DIAGNOSIS — K8309 Other cholangitis: Secondary | ICD-10-CM | POA: Diagnosis not present

## 2021-06-30 DIAGNOSIS — D75838 Other thrombocytosis: Secondary | ICD-10-CM

## 2021-06-30 DIAGNOSIS — A4151 Sepsis due to Escherichia coli [E. coli]: Secondary | ICD-10-CM | POA: Diagnosis not present

## 2021-06-30 LAB — COMPREHENSIVE METABOLIC PANEL
ALT: 28 U/L (ref 0–44)
AST: 42 U/L — ABNORMAL HIGH (ref 15–41)
Albumin: 1.7 g/dL — ABNORMAL LOW (ref 3.5–5.0)
Alkaline Phosphatase: 347 U/L — ABNORMAL HIGH (ref 38–126)
Anion gap: 3 — ABNORMAL LOW (ref 5–15)
BUN: 8 mg/dL (ref 8–23)
CO2: 24 mmol/L (ref 22–32)
Calcium: 7.9 mg/dL — ABNORMAL LOW (ref 8.9–10.3)
Chloride: 108 mmol/L (ref 98–111)
Creatinine, Ser: 0.39 mg/dL — ABNORMAL LOW (ref 0.44–1.00)
GFR, Estimated: >60 mL/min (ref >60)
Glucose, Bld: 107 mg/dL — ABNORMAL HIGH (ref 70–99)
Potassium: 3.6 mmol/L (ref 3.5–5.1)
Sodium: 135 mmol/L (ref 135–145)
Total Bilirubin: 9.4 mg/dL — ABNORMAL HIGH (ref 0.3–1.2)
Total Protein: 6.4 g/dL — ABNORMAL LOW (ref 6.5–8.1)

## 2021-06-30 LAB — PROTIME-INR
INR: 1.3 — ABNORMAL HIGH (ref 0.8–1.2)
Prothrombin Time: 16.2 seconds — ABNORMAL HIGH (ref 11.4–15.2)

## 2021-06-30 LAB — MAGNESIUM: Magnesium: 1.9 mg/dL (ref 1.7–2.4)

## 2021-06-30 LAB — PHOSPHORUS: Phosphorus: 1.8 mg/dL — ABNORMAL LOW (ref 2.5–4.6)

## 2021-06-30 MED ORDER — SODIUM CHLORIDE 0.9 % IV SOLN: 3.0000 g | Freq: Four times a day (QID) | INTRAVENOUS | Status: DC

## 2021-06-30 MED ORDER — ENSURE ENLIVE PO LIQD: 237.0000 mL | Freq: Two times a day (BID) | ORAL | 12 refills | Status: DC

## 2021-06-30 MED ORDER — POTASSIUM PHOSPHATES 15 MMOLE/5ML IV SOLN
30.0000 mmol | Freq: Once | INTRAVENOUS | Status: AC
  Administered 2021-06-30: 30 mmol via INTRAVENOUS
  Filled 2021-06-30: qty 10

## 2021-06-30 MED ORDER — HYDROMORPHONE HCL 1 MG/ML IJ SOLN: 0.5000 mg | INTRAMUSCULAR | 0 refills | Status: DC | PRN

## 2021-06-30 MED ORDER — ENSURE ENLIVE PO LIQD
237.0000 mL | Freq: Two times a day (BID) | ORAL | Status: DC
  Administered 2021-06-30: 237 mL via ORAL

## 2021-06-30 MED ORDER — TRAZODONE HCL 50 MG PO TABS: 25.0000 mg | ORAL_TABLET | Freq: Every evening | ORAL | Status: DC | PRN

## 2021-06-30 MED ORDER — SODIUM CHLORIDE 0.9 % IV SOLN: INTRAVENOUS | Status: DC | PRN

## 2021-06-30 NOTE — Progress Notes (Signed)
PROGRESS NOTE    Rachel Montoya  VOZ:[id number] DOB: [birth year] DOA: 06/26/2021 PCP: Pcp, No    Brief Narrative:  Rachel Montoya is a 70 y.o. Hispanic female with medical history significant for depression, anxiety and hypertension coming with altered mental status with confusion as well as nausea and vomiting since 11/23 with associated jaundice, chills and right upper quadrant abdominal pain. Patient has a bilirubin of 18.  CT abdomen/pelvis showed acute gangrenous cholecystitis, biliary duct dilatation with possible obstruction at the level of pancreas.  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 Patient had a ERCP performed 11/29 by Dr. Allen Norris, due to multiple biliary obstructions, it is uncertain whether this obstruction has been resolved.  Patient also pending for cholecystostomy.  And followed by general surgery. Transhepatic bilary drain was performed 12/1. Patient will need outpatient EUS which can be arranged by RN. Mariea Clonts.    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 Gangrenous cholecystitis. Severe sepsis with E. coli septicemia. Patient is status post ERCP and transhepatic biliary tract drain.  Jaundice is improving. Discussed with Dr. Peyton Najjar today, but the patient condition is improving, was not looking for additional intervention for the gallbladder. Patient will need a EUS with biopsy for prognosis and treatment options for metastatic pancreatic/biliary cancer. Appreciate oncology consult. Patient currently on Unasyn to cover E. coli as well as anaerobic bacteria in the gallbladder. Patient prognosis still very poor.  Hypokalemia. Hyponatremia. Hypophosphatemia. Patient is started on diet by general  surgery, discontinue fluids. Will give potassium phosphate.  Morbid obesity. Severe hypoalbuminemia. Start protein supplement.      DVT prophylaxis: SCDs Code Status: full Family Communication: Daughter was updated. Disposition Plan:    Status is: Inpatient  Remains inpatient appropriate because: Severity of disease, complex medical problems and IV antibiotics.        I/O last 3 completed shifts: In: 1865.7 [I.V.:1730.4; IV Piggyback:135.3] Out: 4259 [Urine:700; Drains:890] Total I/O In: 360 [P.O.:360] Out: -      Consultants:  Surgery, oncology and GI.  Procedures: ERCP and transhepatic biliary drain.  Antimicrobials: Unasyn.  Subjective: Patient feels better today, skin is less yellow. Still feel fatigue, but better than yesterday. She was able to tolerate a full liquid diet without nausea vomiting.  No abdominal pain  No fever chills  No dysuria hematuria.  Objective: Vitals:   06/29/21 2005 06/30/21 0421 06/30/21 0812 06/30/21 1202  BP: (!) 111/57 123/63 114/63 (!) 117/58  Pulse: 83 79 75 80  Resp: 18 17 16 18   Temp: 98.5 F (36.9 C)  (!) 97.5 F (36.4 C) (!) 97.5 F (36.4 C)  TempSrc:      SpO2: 100% 100% 100% 100%  Weight:      Height:        Intake/Output Summary (Last 24 hours) at 06/30/2021 1226 Last data filed at 06/30/2021 1049 Gross per 24 hour  Intake 1122.91 ml  Output 540 ml  Net 582.91 ml   Filed Weights   06/26/21 0904  Weight: 108.9 kg    Examination:  General exam: Appears calm and comfortable  Respiratory system: Clear to auscultation. Respiratory effort normal. Cardiovascular system: S1 & S2 heard, RRR. No JVD, murmurs, rubs, gallops or clicks. No pedal edema. Gastrointestinal system: Abdomen is nondistended, soft and nontender.  No organomegaly or masses felt. Normal bowel sounds heard. Central nervous system: Alert and oriented. No focal neurological deficits. Extremities: Symmetric 5 x 5 power. Skin: No rashes,  lesions or ulcers Psychiatry: Judgement and insight appear normal. Mood & affect appropriate.     Data Reviewed: I have personally reviewed following labs and imaging studies  CBC: Recent Labs  Lab 06/26/21 0858 06/27/21 0215 06/28/21 0441 06/29/21 0128  WBC 14.0* 13.2* 10.0 16.9*  NEUTROABS 12.0*  --  7.8* 15.5*  HGB 11.5* 9.5* 9.6* 9.7*  HCT 34.4* 29.2* 29.8* 30.0*  MCV 92.0 92.1 94.0 95.8  PLT 439* 371 429* 782*   Basic Metabolic Panel: Recent Labs  Lab 06/26/21 0858 06/26/21 1550 06/27/21 0215 06/28/21 0441 06/29/21 0128 06/30/21 0505  NA 129* 130* 132* 137 137 135  K 2.7* 3.3* 3.7 4.1 4.3 3.6  CL 91* 99 104 110 112* 108  CO2 27 24 25 22  21* 24  GLUCOSE 117* 129* 133* 73 140* 107*  BUN 10 7* 11 15 11 8   CREATININE <0.30* <0.30* 0.35* 0.35* 0.45 0.39*  CALCIUM 8.5* 7.7* 7.7* 7.4* 7.9* 7.9*  MG 1.9  --   --  1.9 1.9 1.9  PHOS  --   --   --  UNABLE TO REPORT DUE TO ICTERUS  --  1.8*   GFR: Estimated Creatinine Clearance: 66.9 mL/min (A) (by C-G formula based on SCr of 0.39 mg/dL (L)). Liver Function Tests: Recent Labs  Lab 06/26/21 0858 06/27/21 0215 06/28/21 0441 06/29/21 0128 06/30/21 0505  AST 105* 84* 69* 69* 42*  ALT 47* 37 36 35 28  ALKPHOS 521* 386* 344* 379* 347*  BILITOT 20.9* 18.0* 14.3* 11.2* 9.4*  PROT 7.4 6.2* 5.6* 6.1* 6.4*  ALBUMIN 2.0* 1.6* 1.5* 1.6* 1.7*   No results for input(s): LIPASE, AMYLASE in the last 168 hours. Recent Labs  Lab 06/26/21 0858  AMMONIA 47*   Coagulation Profile: Recent Labs  Lab 06/26/21 0858 06/27/21 0215 06/30/21 0505  INR 1.3* 1.5* 1.3*   Cardiac Enzymes: No results for input(s): CKTOTAL, CKMB, CKMBINDEX, TROPONINI in the last 168 hours. BNP (last 3 results) No results for input(s): PROBNP in the last 8760 hours. HbA1C: No results for input(s): HGBA1C in the last 72 hours. CBG: No results for input(s): GLUCAP in the last 168 hours. Lipid Profile: No results for input(s): CHOL, HDL, LDLCALC,  TRIG, CHOLHDL, LDLDIRECT in the last 72 hours. Thyroid Function Tests: No results for input(s): TSH, T4TOTAL, FREET4, T3FREE, THYROIDAB in the last 72 hours. Anemia Panel: No results for input(s): VITAMINB12, FOLATE, FERRITIN, TIBC, IRON, RETICCTPCT in the last 72 hours. Sepsis Labs: Recent Labs  Lab 06/26/21 0858 06/26/21 2118 06/26/21 2253 06/27/21 0215  PROCALCITON  --   --   --  1.29  LATICACIDVEN 1.7 1.2 1.1  --     Recent Results (from the past 240 hour(s))  Blood Culture (routine x 2)     Status: Abnormal   Collection Time: 06/26/21  8:58 AM   Specimen: BLOOD  Result Value Ref Range Status   Specimen Description   Final    BLOOD BLOOD LEFT ARM Performed at [facility], [address] [place], [place] [address]    Special Requests   Final    BOTTLES DRAWN AEROBIC AND ANAEROBIC Blood Culture adequate volume Performed at [facility], [address] [place], [place] [address]    Culture  Setup Time   Final    ANAEROBIC BOTTLE ONLY Lonell Grandchild  NEGATIVE RODS CRITICAL VALUE NOTED.  VALUE IS CONSISTENT WITH PREVIOUSLY REPORTED AND CALLED VALUE. AEROBIC BOTTLE ONLY GRAM POSITIVE COCCI Performed at [facility], [address] [place], [place] [address]    Culture (A)  Final    ESCHERICHIA COLI SUSCEPTIBILITIES PERFORMED ON PREVIOUS CULTURE WITHIN THE LAST 5 DAYS. STAPHYLOCOCCUS EPIDERMIDIS THE SIGNIFICANCE OF ISOLATING THIS ORGANISM FROM A SINGLE SET OF BLOOD CULTURES WHEN MULTIPLE SETS ARE DRAWN IS UNCERTAIN. PLEASE NOTIFY THE MICROBIOLOGY DEPARTMENT WITHIN ONE WEEK IF SPECIATION AND SENSITIVITIES ARE REQUIRED. Performed at [facility], [address], [place], [place] [address]    Report Status 06/29/2021 FINAL  Final  Resp Panel by RT-PCR (Flu A&B, Covid) Nasopharyngeal Swab     Status: None   Collection Time: 06/26/21  8:58 AM   Specimen: Nasopharyngeal Swab; Nasopharyngeal(NP) swabs in vial transport medium  Result Value Ref Range  Status   SARS Coronavirus 2 by RT PCR NEGATIVE NEGATIVE Final    Comment: (NOTE) SARS-CoV-2 target nucleic acids are NOT DETECTED.  The SARS-CoV-2 RNA is generally detectable in upper respiratory specimens during the acute phase of infection. The lowest concentration of SARS-CoV-2 viral copies this assay can detect is 138 copies/mL. A negative result does not preclude SARS-Cov-2 infection and should not be used as the sole basis for treatment or other patient management decisions. A negative result may occur with  improper specimen collection/handling, submission of specimen other than nasopharyngeal swab, presence of viral mutation(s) within the areas targeted by this assay, and inadequate number of viral copies(<138 copies/mL). A negative result must be combined with clinical observations, patient history, and epidemiological information. The expected result is Negative.  Fact Sheet for Patients:  EntrepreneurPulse.com.au  Fact Sheet for Healthcare Providers:  IncredibleEmployment.be  This test is no t yet approved or cleared by the [place] FDA and  has been authorized for detection and/or diagnosis of SARS-CoV-2 by FDA under an Emergency Use Authorization (EUA). This EUA will remain  in effect (meaning this test can be used) for the duration of the COVID-19 declaration under Section 564(b)(1) of the Act, 21 U.S.C.section 360bbb-3(b)(1), unless the authorization is terminated  or revoked sooner.       Influenza A by PCR NEGATIVE NEGATIVE Final   Influenza B by PCR NEGATIVE NEGATIVE Final    Comment: (NOTE) The Xpert Xpress SARS-CoV-2/FLU/RSV plus assay is intended as an aid in the diagnosis of influenza from Nasopharyngeal swab specimens and should not be used as a sole basis for treatment. Nasal washings and aspirates are unacceptable for Xpert Xpress SARS-CoV-2/FLU/RSV testing.  Fact Sheet for  Patients: EntrepreneurPulse.com.au  Fact Sheet for Healthcare Providers: IncredibleEmployment.be  This test is not yet approved or cleared by the [place] FDA and has been authorized for detection and/or diagnosis of SARS-CoV-2 by FDA under an Emergency Use Authorization (EUA). This EUA will remain in effect (meaning this test can be used) for the duration of the COVID-19 declaration under Section 564(b)(1) of the Act, 21 U.S.C. section 360bbb-3(b)(1), unless the authorization is terminated or revoked.  Performed at [facility], [address] [place], [place] [address]   Blood Culture ID Panel (Reflexed)     Status: Abnormal   Collection Time: 06/26/21  8:58 AM  Result Value Ref Range Status   Enterococcus faecalis NOT DETECTED NOT DETECTED Final   Enterococcus Faecium NOT DETECTED NOT DETECTED Final   Listeria monocytogenes NOT DETECTED NOT DETECTED Final   Staphylococcus species DETECTED (A) NOT DETECTED  Final    Comment: CRITICAL RESULT CALLED TO, READ BACK BY AND VERIFIED WITH: NATHAN BELUE PHARMD 0422 06/27/21 HNM    Staphylococcus aureus (BCID) NOT DETECTED NOT DETECTED Final   Staphylococcus epidermidis DETECTED (A) NOT DETECTED Final    Comment: CRITICAL RESULT CALLED TO, READ BACK BY AND VERIFIED WITH: NATHAN BELUE PHARMD 0422 06/27/21 HNM    Staphylococcus lugdunensis NOT DETECTED NOT DETECTED Final   Streptococcus species NOT DETECTED NOT DETECTED Final   Streptococcus agalactiae NOT DETECTED NOT DETECTED Final   Streptococcus pneumoniae NOT DETECTED NOT DETECTED Final   Streptococcus pyogenes NOT DETECTED NOT DETECTED Final   A.calcoaceticus-baumannii NOT DETECTED NOT DETECTED Final   Bacteroides fragilis NOT DETECTED NOT DETECTED Final   Enterobacterales NOT DETECTED NOT DETECTED Final   Enterobacter cloacae complex NOT DETECTED NOT DETECTED Final   Escherichia coli NOT DETECTED NOT DETECTED Final   Klebsiella  aerogenes NOT DETECTED NOT DETECTED Final   Klebsiella oxytoca NOT DETECTED NOT DETECTED Final   Klebsiella pneumoniae NOT DETECTED NOT DETECTED Final   Proteus species NOT DETECTED NOT DETECTED Final   Salmonella species NOT DETECTED NOT DETECTED Final   Serratia marcescens NOT DETECTED NOT DETECTED Final   Haemophilus influenzae NOT DETECTED NOT DETECTED Final   Neisseria meningitidis NOT DETECTED NOT DETECTED Final   Pseudomonas aeruginosa NOT DETECTED NOT DETECTED Final   Stenotrophomonas maltophilia NOT DETECTED NOT DETECTED Final   Candida albicans NOT DETECTED NOT DETECTED Final   Candida auris NOT DETECTED NOT DETECTED Final   Candida glabrata NOT DETECTED NOT DETECTED Final   Candida krusei NOT DETECTED NOT DETECTED Final   Candida parapsilosis NOT DETECTED NOT DETECTED Final   Candida tropicalis NOT DETECTED NOT DETECTED Final   Cryptococcus neoformans/gattii NOT DETECTED NOT DETECTED Final   Methicillin resistance mecA/C NOT DETECTED NOT DETECTED Final    Comment: Performed at [facility], [address] [place], [place] [address]  Blood Culture (routine x 2)     Status: Abnormal   Collection Time: 06/26/21  8:59 AM   Specimen: BLOOD  Result Value Ref Range Status   Specimen Description   Final    BLOOD RIGHT ANTECUBITAL Performed at [facility], [address] [place], [place] [address]    Special Requests   Final    BOTTLES DRAWN AEROBIC AND ANAEROBIC Blood Culture adequate volume Performed at [facility], [address] [place], [place] [address]    Culture  Setup Time   Final    GRAM NEGATIVE RODS IN BOTH AEROBIC AND ANAEROBIC BOTTLES CRITICAL RESULT CALLED TO, READ BACK BY AND VERIFIED WITH: BRENNAN BEERS @ 2153 ON 06/26/21.Marland KitchenMarland KitchenTKR Performed at [facility], [address], [place], [place] [address]    Culture ESCHERICHIA COLI (A)  Final   Report Status 06/29/2021 FINAL  Final   Organism ID, Bacteria ESCHERICHIA COLI   Final      Susceptibility   Escherichia coli - MIC*    AMPICILLIN <=2 SENSITIVE Sensitive     CEFAZOLIN <=4 SENSITIVE Sensitive     CEFEPIME <=0.12 SENSITIVE Sensitive     CEFTAZIDIME <=1 SENSITIVE Sensitive     CEFTRIAXONE <=0.25 SENSITIVE Sensitive     CIPROFLOXACIN <=0.25 SENSITIVE Sensitive     GENTAMICIN <=1 SENSITIVE Sensitive     IMIPENEM <=0.25 SENSITIVE Sensitive     TRIMETH/SULFA <=20 SENSITIVE Sensitive     AMPICILLIN/SULBACTAM <=2 SENSITIVE Sensitive     PIP/TAZO <=4 SENSITIVE Sensitive     *  ESCHERICHIA COLI  Blood Culture ID Panel (Reflexed)     Status: Abnormal   Collection Time: 06/26/21  8:59 AM  Result Value Ref Range Status   Enterococcus faecalis NOT DETECTED NOT DETECTED Final   Enterococcus Faecium NOT DETECTED NOT DETECTED Final   Listeria monocytogenes NOT DETECTED NOT DETECTED Final   Staphylococcus species NOT DETECTED NOT DETECTED Final   Staphylococcus aureus (BCID) NOT DETECTED NOT DETECTED Final   Staphylococcus epidermidis NOT DETECTED NOT DETECTED Final   Staphylococcus lugdunensis NOT DETECTED NOT DETECTED Final   Streptococcus species NOT DETECTED NOT DETECTED Final   Streptococcus agalactiae NOT DETECTED NOT DETECTED Final   Streptococcus pneumoniae NOT DETECTED NOT DETECTED Final   Streptococcus pyogenes NOT DETECTED NOT DETECTED Final   A.calcoaceticus-baumannii NOT DETECTED NOT DETECTED Final   Bacteroides fragilis NOT DETECTED NOT DETECTED Final   Enterobacterales DETECTED (A) NOT DETECTED Final    Comment: Enterobacterales represent a large order of gram negative bacteria, not a single organism. CRITICAL RESULT CALLED TO, READ BACK BY AND VERIFIED WITH: BRENNAN BEERS @ 2152 ON 06/26/21.Marland KitchenMarland KitchenTKR    Enterobacter cloacae complex NOT DETECTED NOT DETECTED Final   Escherichia coli DETECTED (A) NOT DETECTED Final    Comment: CRITICAL RESULT CALLED TO, READ BACK BY AND VERIFIED WITH: BRENNAN BEERS @ 2152 ON 06/26/21.Marland KitchenMarland KitchenTKR    Klebsiella aerogenes  NOT DETECTED NOT DETECTED Final   Klebsiella oxytoca NOT DETECTED NOT DETECTED Final   Klebsiella pneumoniae NOT DETECTED NOT DETECTED Final   Proteus species NOT DETECTED NOT DETECTED Final   Salmonella species NOT DETECTED NOT DETECTED Final   Serratia marcescens NOT DETECTED NOT DETECTED Final   Haemophilus influenzae NOT DETECTED NOT DETECTED Final   Neisseria meningitidis NOT DETECTED NOT DETECTED Final   Pseudomonas aeruginosa NOT DETECTED NOT DETECTED Final   Stenotrophomonas maltophilia NOT DETECTED NOT DETECTED Final   Candida albicans NOT DETECTED NOT DETECTED Final   Candida auris NOT DETECTED NOT DETECTED Final   Candida glabrata NOT DETECTED NOT DETECTED Final   Candida krusei NOT DETECTED NOT DETECTED Final   Candida parapsilosis NOT DETECTED NOT DETECTED Final   Candida tropicalis NOT DETECTED NOT DETECTED Final   Cryptococcus neoformans/gattii NOT DETECTED NOT DETECTED Final   CTX-M ESBL NOT DETECTED NOT DETECTED Final   Carbapenem resistance IMP NOT DETECTED NOT DETECTED Final   Carbapenem resistance KPC NOT DETECTED NOT DETECTED Final   Carbapenem resistance NDM NOT DETECTED NOT DETECTED Final   Carbapenem resist OXA 48 LIKE NOT DETECTED NOT DETECTED Final   Carbapenem resistance VIM NOT DETECTED NOT DETECTED Final    Comment: Performed at [facility], [address] [place], [place] [address]  CULTURE, BLOOD (ROUTINE X 2) w Reflex to ID Panel     Status: None (Preliminary result)   Collection Time: 06/29/21  1:21 PM   Specimen: BLOOD  Result Value Ref Range Status   Specimen Description BLOOD BLOOD RIGHT FOREARM  Final   Special Requests   Final    BOTTLES DRAWN AEROBIC AND ANAEROBIC Blood Culture adequate volume   Culture   Final    NO GROWTH < 24 HOURS Performed at [facility], [address] [place], [place] [address]    Report Status PENDING  Incomplete  CULTURE, BLOOD (ROUTINE X 2) w Reflex to ID Panel     Status: None (Preliminary  result)   Collection Time: 06/29/21  1:21 PM   Specimen: BLOOD  Result Value Ref Range Status   Specimen Description  BLOOD BLOOD RIGHT HAND  Final   Special Requests   Final    BOTTLES DRAWN AEROBIC AND ANAEROBIC Blood Culture adequate volume   Culture   Final    NO GROWTH < 24 HOURS Performed at [facility], [address] [place], [place] [address]    Report Status PENDING  Incomplete         Radiology Studies: IR BILIARY DRAIN PLACEMENT WITH CHOLANGIOGRAM  Result Date: 06/28/2021 INDICATION: 70 year old female with history of persistent biliary obstruction after endoscopically placed common bile duct stent with suggestion of hilar obstructive mass. EXAM: 1. Percutaneous cholangiogram. 2. Percutaneous transhepatic biliary drain placement. MEDICATIONS: The patient is currently receiving intravenous Zosyn as an inpatient. ANESTHESIA/SEDATION: Moderate (conscious) sedation was employed during this procedure. A total of Versed 2.5 mg and Fentanyl 125 mcg was administered intravenously by the radiology nurse. Total intra-service moderate Sedation Time: 74 minutes. The patient's level of consciousness and vital signs were monitored continuously by radiology nursing throughout the procedure under my direct supervision. FLUOROSCOPY TIME:  Fluoroscopy Time: 24 minutes 36 seconds (448.5 mGy). CONTRAST:  60 mL Omnipaque 299, biliary COMPLICATIONS: None immediate. PROCEDURE: Informed written consent was obtained from the patient after a thorough discussion of the procedural risks, benefits and alternatives. All questions were addressed. Maximal Sterile Barrier Technique was utilized including caps, mask, sterile gowns, sterile gloves, sterile drape, hand hygiene and skin antiseptic. A timeout was performed prior to the initiation of the procedure. The right upper abdomen was prepped and draped in standard fashion. Preprocedure ultrasound evaluation demonstrated prominent bilateral bile ducts.  A right-sided bile duct was initially targeted under ultrasound guidance. Subdermal Local anesthesia was provided 1% lidocaine at the planned needle entry site. Under direct ultrasound visualization, a 21 gauge needle was directed into a peripheral right biliary radicle. Gentle hand injection of contrast demonstrated intraluminal location within the biliary tree. A Nitrex wire was directed centrally within the biliary tree. A 6 [place] Accustick set was then attempted to be inserted, however near the entry site of the bile duct, there is abrupt tortuosity and the coaxial sheath would not track over the wire. Therefore attempted left-sided biliary tree access was performed under ultrasound guidance. Subdermal Local anesthesia was provided with 1% lidocaine at the planned needle entry site. Under direct ultrasound visualization, a 21 gauge needle was directed into a peripheral left biliary radicle. Gentle hand injection of contrast demonstrated intraluminal location within the biliary tree, however Nitrex wire was unable to select the bile duct to tortuosity. Despite inability to cannulate the biliary tree, there was adequate opacification for fluoroscopic guided direct stick. Therefore an inferior, posterior right biliary radicle was targeted under fluoroscopic guidance with a 21 gauge micropuncture needle. Hand injection of contrast demonstrated intraluminal biliary position. A Nitrex wire was then inserted and directed with ease toward the central bile ducts. A small skin nick was made. A 6 French Accustick set was then introduced into the central biliary tree. Hand injection of contrast confirmed position. A Glidewire was then used to attempt to traverse the biliary confluence without success. Therefore, an Amplatz wire was inserted and the 6 [place] Accustick set was exchanged for a 7 [place], angled tip braided sheath, through which a 5 [place] Kumpe the catheter and Glidewire were inserted in attempt to traverse  the biliary obstruction. This was also unsuccessful., therefore a 10 [place] multipurpose drain with the pigtail string cut was inserted into the right-sided biliary tree to serve as an external drain. The drain  was affixed to the skin with a 0 silk suture. The drain was placed to bag drainage. A sterile bandage was applied. The patient tolerated the procedure well without immediate complication and was transferred back to the floor in good condition. IMPRESSION: 1. Percutaneous cholangiogram demonstrates diffuse intrahepatic biliary ductal dilation with obstruction at the biliary confluence. 2. Technically successful placement of a right-sided 10.2 [place] external biliary drain. PLAN: Keep to bag drainage and continue to trend bilirubin levels. As indicated, additional attempt at internalization and possible brush biopsy could be performed by Interventional Radiology. Otherwise, plan to return in approximately 8 weeks for routine check and exchange. Ruthann Cancer, MD [facility] Radiology Electronically Signed   By: Ruthann Cancer M.D.   On: 06/28/2021 16:34        Scheduled Meds:  midodrine  5 mg Oral TID WC   sodium chloride flush  5 mL Intracatheter Q8H   Continuous Infusions:  sodium chloride 10 mL/hr at 06/30/21 0621   ampicillin-sulbactam (UNASYN) IV 3 g (06/30/21 0621)   dextrose 5 % and 0.9% NaCl 50 mL/hr at 06/29/21 1424   potassium PHOSPHATE IVPB (in mmol) 30 mmol (06/30/21 1020)     LOS: 4 days    Time spent: 32 minutes, more than 50% time involved in direct patient care.    Sharen Hones, MD [facility]   To contact the attending provider between 7A-7P or the covering provider during after hours 7P-7A, please log into the web site [email or web address] and access using universal [place] password for that web site. If you do not have the password, please call the hospital operator.  06/30/2021, 12:26 PM

## 2021-06-30 NOTE — Evaluation (Signed)
Occupational Therapy Evaluation Patient Details Name: Rachel Montoya MRN: [id number] DOB: July 27, 1951 Today's Date: 06/30/2021   History of Present Illness Rachel Montoya is a 70yoF (Spanish speaking) who comes to [facility] 11/29 c nausea, vomiting, RUQ/RLQ pain. DTR reports pt was NOT having AMS as read in [place] ED notes. Pt admitted with leukocytosis, LFT elevation, and hyperbilirubinemia concerning for obstructing juandice secondary to pancreatic vs biliary malignancy with  possible concomitant cholecystitis but unlikely necrotizing infection. At baseline, pt lives with DTR locally, no frank mobility difficulty, but per DTR prefers to be homebound, does not like to go out. No DME use or falls history PTA.   Clinical Impression   Pt seen for OT evaluation this date in setting of acute hospitalization d/t obstructing jaundice. Pt and family report that she is INDEP with self care and mobility at baseline including assisting with caring for grand kids. Pt presents this date with decreased fxl activity tolerance and edema, limiting her ability to safely and efficiently perform ADLs/ADL mobility. She requires CGA for ADL transfers and fxl mobility with HHA as well as MOD A to manage lower extremities back to bed. D/t lower extremity ROM being limited 2/2 edema, pt currently requiring MOD A for LB ADLs such as donning clothing. Pt Able to perform UB ADLs with SETUP in sitting. Pt with supportive family at home. Anticipate she can return home with [place] f/u to ensure she becomes more efficient and safe with her ADLs in natural environment.      Recommendations for follow up therapy are one component of a multi-disciplinary discharge planning process, led by the attending physician.  Recommendations may be updated based on patient status, additional functional criteria and insurance authorization.   Follow Up Recommendations  Home health OT    Assistance Recommended at Discharge Intermittent  Supervision/Assistance  Functional Status Assessment  Patient has had a recent decline in their functional status and demonstrates the ability to make significant improvements in function in a reasonable and predictable amount of time.  Equipment Recommendations  Tub/shower seat    Recommendations for Other Services       Precautions / Restrictions Precautions Precautions: Fall Restrictions Weight Bearing Restrictions: No      Mobility Bed Mobility Overal bed mobility: Needs Assistance Bed Mobility: Sit to Supine       Sit to supine: Mod assist   General bed mobility comments: for LE mgt    Transfers Overall transfer level: Needs assistance Equipment used: 1 person hand held assist Transfers: Sit to/from Stand Sit to Stand: Min guard           General transfer comment: increased time      Balance Overall balance assessment: Needs assistance   Sitting balance-Leahy Scale: Good     Standing balance support: Single extremity supported Standing balance-Leahy Scale: Fair                             ADL either performed or assessed with clinical judgement   ADL                                         General ADL Comments: SETUP for seated UB ADLs, MOD A for seated LB ADLs, CGA for ADL mobility with HHA, MOD A for sit to sup to get back to bed d/t edematous lower extremities.  Vision Patient Visual Report: No change from baseline       Perception     Praxis      Pertinent Vitals/Pain Pain Assessment: 0-10 Pain Score: 4  Pain Location: RUQ Pain Descriptors / Indicators: Tender Pain Intervention(s): Limited activity within patient's tolerance;Monitored during session     Hand Dominance Right   Extremity/Trunk Assessment Upper Extremity Assessment Upper Extremity Assessment: Overall WFL for tasks assessed;Generalized weakness (ROM WFL, GRip grossly 4-/5, somewhat arthritic digits. Equal bilaterally)   Lower  Extremity Assessment Lower Extremity Assessment: Defer to PT evaluation;Overall [facility] for tasks assessed;Generalized weakness (edamtous in LEs, difficulty with hip and knee ROM d/t being limited by swelling)       Communication Communication Communication: Interpreter utilized   Cognition Arousal/Alertness: Awake/alert Behavior During Therapy: WFL for tasks assessed/performed Overall Cognitive Status: Within Functional Limits for tasks assessed                                       General Comments       Exercises Other Exercises Other Exercises: OT ed with pt and family re: role, safety including entering/exiting bed d/t difficult elevating LEs   Shoulder Instructions      Home Living Family/patient expects to be discharged to:: Private residence Living Arrangements: Alone (3 grandkids, 20, 15, 8yo) Available Help at Discharge: Family Earnest Bailey DTR lives nearby; [place] works 3rd shift) Type of Home: House Home Access: Stairs to enter Technical brewer of Steps: 4 Entrance Stairs-Rails: Left (rail is wobbly) Home Layout: One level               [place]: None          Prior Functioning/Environment Prior Level of Function : Independent/Modified Independent             Mobility Comments: modI ADLs Comments: independent including IADLs such as helping to look after grandchildren        OT Problem List: Decreased strength;Decreased range of motion;Decreased activity tolerance;Obesity;Increased edema;Decreased knowledge of use of DME or AE      OT Treatment/Interventions: Self-care/ADL training;Therapeutic exercise;Therapeutic activities;Patient/family education;Balance training;DME and/or AE instruction    OT Goals(Current goals can be found in the care plan section) Acute Rehab OT Goals Patient Stated Goal: to go home OT Goal Formulation: With patient/family Time For Goal Achievement: 07/14/21 Potential to Achieve Goals:  Good ADL Goals Pt Will Perform Lower Body Dressing: with supervision;sitting/lateral leans;with adaptive equipment Pt Will Transfer to Toilet: with supervision;ambulating (with LRAD) Pt Will Perform Toileting - Clothing Manipulation and hygiene: with supervision;sit to/from stand  OT Frequency: Min 2X/week   Barriers to D/C:            Co-evaluation              AM-PAC OT "6 Clicks" Daily Activity     Outcome Measure Help from another person eating meals?: None Help from another person taking care of personal grooming?: A Little Help from another person toileting, which includes using toliet, bedpan, or urinal?: A Lot Help from another person bathing (including washing, rinsing, drying)?: A Lot Help from another person to put on and taking off regular upper body clothing?: A Little Help from another person to put on and taking off regular lower body clothing?: A Lot 6 Click Score: 16   End of Session Equipment Utilized During Treatment: Gait belt Nurse Communication: Mobility status  Activity  Tolerance: Patient tolerated treatment well Patient left: in bed;with call bell/phone within reach;with family/visitor present  OT Visit Diagnosis: Unsteadiness on feet (R26.81);Muscle weakness (generalized) (M62.81)                Time: 8257-4935 OT Time Calculation (min): 36 min Charges:  OT General Charges $OT Visit: 1 Visit OT Evaluation $OT Eval Moderate Complexity: 1 Mod OT Treatments $Self Care/Home Management : 8-22 mins  Gerrianne Scale, MS, OTR/L ascom [phone] 06/30/21, 2:06 PM

## 2021-06-30 NOTE — Discharge Summary (Signed)
Physician Discharge Summary  Patient ID: Rachel Montoya MRN: [id number] DOB/AGE: 28-Apr-1951 70 y.o.  Admit date: 06/26/2021 Discharge date: 06/30/2021  Admission Diagnoses:  Discharge Diagnoses:  Principal Problem:   Obstructive jaundice Active Problems:   Gangrenous cholecystitis   Hyponatremia   Hypokalemia   Severe sepsis (HCC)   Reactive thrombocytosis   Pancreatic cancer (HCC)   Hypoalbuminemia   Discharged Condition: fair  Hospital Course:   Rachel Montoya is a 70 y.o. Hispanic female with medical history significant for depression, anxiety and hypertension coming with altered mental status with confusion as well as nausea and vomiting since 11/23 with associated jaundice, chills and right upper quadrant abdominal pain. Patient has a bilirubin of 18.  CT abdomen/pelvis showed acute gangrenous cholecystitis, biliary duct dilatation with possible obstruction at the level of pancreas.  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 Patient had a ERCP performed 11/29 by Dr. Allen Norris, due to multiple biliary obstructions, it is uncertain whether this obstruction has been resolved.  Patient also pending for cholecystostomy.  And followed by general surgery. Transhepatic bilary drain was performed 12/1. Patient is accepted to [facility] today.   Obstructive jaundice secondary to pancreatic/biliary cancer. Gangrenous cholecystitis. Severe sepsis with E. coli septicemia. Patient is status post ERCP and transhepatic biliary tract drain.  Jaundice is improving. Bilirubin dropped to 9.4 from 18.  Patient currently treated with Unasyn to cover E Coli septicemia and possible anaerobic bacteria in the gallbladder.   However, we have limited capacity to deal with gangrenous cholecystitis. This may be addressed by [facility].  Patient is also seen by GI and Oncology, will need EUS with biopsy for prognostic and therapeutic purposes, which could not be  performed in our facility.     Hypokalemia. Hyponatremia. Hypophosphatemia. Patient is started on diet by general surgery, discontinue fluids. Will give potassium phosphate.  Morbid obesity. Severe hypoalbuminemia. Start protein supplement.   Consults: ID, GI, and hematology/oncology  Significant Diagnostic Studies:  CT: 11/29 There is also soft tissue nodularity along the superior aspect of the gallbladder neck that is best seen on coronal image 47 of series 5 but also on axial image 29 of series 2 this measures approximately 13 mm. It is unclear whether this the constellation of findings represents biliary primary with metastasis or pancreatic primary with metastasis.   Ductal obstruction occurs at the level of the pancreas rather than at this location.  U/S: 11/29 Cholelithiasis is noted with moderate gallbladder wall thickening concerning for cholecystitis.   Intrahepatic and extrahepatic biliary dilatation is noted concerning for distal common bile duct obstruction. Potentially this is due to pancreatic mass as suggested on prior CT scan.  MRCP: 11/30  Lower chest: No effusion, no consolidative process. Limited assessment of the lung bases on MRI.   Hepatobiliary: Marked intrahepatic biliary duct distension with similar appearance to previous imaging. Biliary stent has been placed since the previous exam traversing the area near the neck of the pancreas where there is ductal narrowing. Signs of pneumobilia post stent placement. No visible intrahepatic lesion.   Associated with the superior wall the gallbladder is an area of intermediate T2 signal that also contacts the RIGHT hepatic artery and narrows the bile duct (image 10/15). This measures 16 x 11 mm. This shows enhancement. This corresponds to the enhancing area seen on the previous CT in this location. There is some debris in the distal common bile duct in the dependent aspect.   Portal vein is  patent.  Irregular appearance of the gallbladder with numerous gallstones in the   Cystic duct confluence with common hepatic duct appears to be involved at the more peripheral area of narrowing.   Pancreas: Focal pancreatic duct dilation beginning just peripheral to the pancreatic neck (image 43/20) confluent hypointense tissue above the uncinate process posterior to the portal vein showing low level enhancement as well is posterior to the site of pancreatic narrowingaw. There is focal T1 hypointensity in the region of the neck of the pancreas though this is quite subtle. Perhaps measuring up to 1.6 x 1.3 cm (image 53/20) corresponding to T1 hypointensity seen on series 17 in this location.   Posterior to the pancreas and splenic portal confluence is a 2.2 x 1.6 cm area of poorly enhancing tissue suspected to represent either direct extension or celiac nodal metastasis with indistinct margins and associated with the site of biliary duct obstruction along the LEFT lateral margin of the common bile duct open image 45/22)   Spleen:  Unremarkable.   Adrenals/Urinary Tract:  No acute renal findings.   Stomach/Bowel: Unremarkable to the extent evaluated on abdominal MRI.   Vascular/Lymphatic: Distortion of the portal vein at the splenic portal confluence. Soft tissue about the common hepatic artery, RIGHT hepatic artery and the distal celiac trunk. 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   Other:  No ascites.   Musculoskeletal: No suspicious bone lesions identified.   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 No substantial change from recent imaging.   Two discrete areas of narrowing in the  extrahepatic biliary tree, just above the cystic duct confluence and at the level of the cystic duct confluence secondary to poorly enhancing areas of tumor and or nodal disease.   Pancreatic duct obstruction adjacent to site of biliary obstruction with confluent soft tissue near the celiac suspicious for pancreatic primary. This is favored though unusual presentation for biliary primary is considered based on poorly enhancing nodularity near the gallbladder.   Signs of potential vascular involvement as outlined on previous imaging. Pancreatic protocol CT could be helpful. Ultimately EUS may be required for diagnosis.   Post biliary stent placement with persistent marked biliary duct distension. Perhaps stent does not traverse both areas of ductal narrowing. There is however pneumobilia within the intrahepatic bile ducts. The stent is poorly evaluated on the current study. Correlate with ERCP results and intraprocedural data.     Treatments: ERCP, Transhepatic biliary drain, unasyn  Discharge Exam: Blood pressure 130/76, pulse 88, temperature 97.9 F (36.6 C), resp. rate 17, height 4\' 9"  (1.448 m), weight 108.9 kg, SpO2 98 %. General appearance: alert and cooperative Resp: clear to auscultation bilaterally Cardio: regular rate and rhythm, S1, S2 normal, no murmur, click, rub or gallop GI: soft, non-tender; bowel sounds normal; no masses,  no organomegaly Extremities: extremities normal, atraumatic, no cyanosis or edema  Disposition: Discharge disposition: 02-Transferred to [facility]       Discharge Instructions     Diet - low sodium heart healthy   Complete by: As directed    Discharge wound care:   Complete by: As directed    Transferring to [facility]   Increase activity slowly   Complete by: As directed       Allergies as of 06/30/2021   No Known  Allergies      Medication List     TAKE these medications    Ampicillin-Sulbactam 3 g in sodium  chloride 0.9 % 100 mL Inject 3 g into the vein every 6 (six) hours. Start taking on: July 01, 2021   feeding supplement Liqd Take 237 mLs by mouth 2 (two) times daily between meals. Start taking on: July 01, 2021   HYDROmorphone 1 MG/ML injection Commonly known as: DILAUDID Inject 0.5 mLs (0.5 mg total) into the vein every 4 (four) hours as needed for severe pain.   traZODone 50 MG tablet Commonly known as: DESYREL Take 0.5 tablets (25 mg total) by mouth at bedtime as needed for sleep.               Discharge Care Instructions  (From admission, onward)           Start     Ordered   06/30/21 0000  Discharge wound care:       Comments: Transferring to [facility]   06/30/21 1907            35 minutes Signed: Danford Bad Tareka Jhaveri 06/30/2021, 7:07 PM

## 2021-07-03 NOTE — Anesthesia Postprocedure Evaluation (Signed)
Anesthesia Post Note  Patient: Rachel Montoya  Procedure(s) Performed: ENDOSCOPIC RETROGRADE CHOLANGIOPANCREATOGRAPHY (ERCP)  Patient location during evaluation: PACU Anesthesia Type: General Level of consciousness: awake and alert Pain management: pain level controlled Vital Signs Assessment: post-procedure vital signs reviewed and stable Respiratory status: spontaneous breathing, nonlabored ventilation, respiratory function stable and patient connected to nasal cannula oxygen Cardiovascular status: blood pressure returned to baseline and stable Postop Assessment: no apparent nausea or vomiting Anesthetic complications: no   No notable events documented.   Last Vitals:  Vitals:   06/30/21 1939 06/30/21 2013  BP: 116/68 (!) 118/55  Pulse: 76 85  Resp: 17 18  Temp: 36.8 C 36.7 C  SpO2: 99% 100%    Last Pain:  Vitals:   06/30/21 0945  TempSrc:   PainSc: 0-No pain                 Martha Clan

## 2021-07-04 LAB — CULTURE, BLOOD (ROUTINE X 2)
Culture: NO GROWTH
Culture: NO GROWTH
Special Requests: ADEQUATE
Special Requests: ADEQUATE

## 2022-03-23 ENCOUNTER — Emergency Department: Payer: Medicare Other

## 2022-03-23 ENCOUNTER — Inpatient Hospital Stay: Payer: Medicare Other

## 2022-03-23 ENCOUNTER — Inpatient Hospital Stay
Admission: EM | Admit: 2022-03-23 | Discharge: 2022-03-29 | DRG: 435 | Disposition: E | Payer: Medicare Other | Attending: Pulmonary Disease | Admitting: Pulmonary Disease

## 2022-03-23 DIAGNOSIS — Z6831 Body mass index (BMI) 31.0-31.9, adult: Secondary | ICD-10-CM

## 2022-03-23 DIAGNOSIS — I5023 Acute on chronic systolic (congestive) heart failure: Secondary | ICD-10-CM | POA: Diagnosis present

## 2022-03-23 DIAGNOSIS — E43 Unspecified severe protein-calorie malnutrition: Secondary | ICD-10-CM | POA: Diagnosis present

## 2022-03-23 DIAGNOSIS — Z515 Encounter for palliative care: Secondary | ICD-10-CM | POA: Diagnosis not present

## 2022-03-23 DIAGNOSIS — C259 Malignant neoplasm of pancreas, unspecified: Principal | ICD-10-CM | POA: Diagnosis present

## 2022-03-23 DIAGNOSIS — J9601 Acute respiratory failure with hypoxia: Secondary | ICD-10-CM | POA: Diagnosis not present

## 2022-03-23 DIAGNOSIS — Z9911 Dependence on respirator [ventilator] status: Secondary | ICD-10-CM

## 2022-03-23 DIAGNOSIS — C7800 Secondary malignant neoplasm of unspecified lung: Secondary | ICD-10-CM | POA: Diagnosis present

## 2022-03-23 DIAGNOSIS — R0902 Hypoxemia: Secondary | ICD-10-CM | POA: Diagnosis not present

## 2022-03-23 DIAGNOSIS — R579 Shock, unspecified: Secondary | ICD-10-CM | POA: Diagnosis present

## 2022-03-23 DIAGNOSIS — I509 Heart failure, unspecified: Secondary | ICD-10-CM

## 2022-03-23 DIAGNOSIS — I513 Intracardiac thrombosis, not elsewhere classified: Secondary | ICD-10-CM | POA: Diagnosis present

## 2022-03-23 DIAGNOSIS — J9621 Acute and chronic respiratory failure with hypoxia: Secondary | ICD-10-CM | POA: Diagnosis present

## 2022-03-23 DIAGNOSIS — D72829 Elevated white blood cell count, unspecified: Secondary | ICD-10-CM | POA: Diagnosis present

## 2022-03-23 DIAGNOSIS — I959 Hypotension, unspecified: Secondary | ICD-10-CM

## 2022-03-23 DIAGNOSIS — Z9221 Personal history of antineoplastic chemotherapy: Secondary | ICD-10-CM | POA: Diagnosis not present

## 2022-03-23 DIAGNOSIS — E8809 Other disorders of plasma-protein metabolism, not elsewhere classified: Secondary | ICD-10-CM | POA: Diagnosis present

## 2022-03-23 DIAGNOSIS — R7401 Elevation of levels of liver transaminase levels: Secondary | ICD-10-CM | POA: Diagnosis present

## 2022-03-23 DIAGNOSIS — Z66 Do not resuscitate: Secondary | ICD-10-CM | POA: Diagnosis present

## 2022-03-23 DIAGNOSIS — Z20822 Contact with and (suspected) exposure to covid-19: Secondary | ICD-10-CM | POA: Diagnosis present

## 2022-03-23 DIAGNOSIS — J910 Malignant pleural effusion: Secondary | ICD-10-CM | POA: Diagnosis present

## 2022-03-23 DIAGNOSIS — E875 Hyperkalemia: Secondary | ICD-10-CM | POA: Diagnosis present

## 2022-03-23 DIAGNOSIS — J9620 Acute and chronic respiratory failure, unspecified whether with hypoxia or hypercapnia: Secondary | ICD-10-CM | POA: Diagnosis present

## 2022-03-23 DIAGNOSIS — Z978 Presence of other specified devices: Secondary | ICD-10-CM

## 2022-03-23 HISTORY — DX: Unspecified severe protein-calorie malnutrition: E43

## 2022-03-23 HISTORY — DX: Unspecified systolic (congestive) heart failure: I50.20

## 2022-03-23 HISTORY — DX: Intracardiac thrombosis, not elsewhere classified: I51.3

## 2022-03-23 HISTORY — DX: Malignant neoplasm of pancreas, unspecified: C25.9

## 2022-03-23 HISTORY — DX: Chronic respiratory failure with hypoxia: J96.11

## 2022-03-23 HISTORY — DX: Malignant (primary) neoplasm, unspecified: C80.1

## 2022-03-23 HISTORY — DX: Malignant pleural effusion: J91.0

## 2022-03-23 LAB — BLOOD GAS, ARTERIAL
Acid-Base Excess: 1.8 mmol/L (ref 0.0–2.0)
Bicarbonate: 25.7 mmol/L (ref 20.0–28.0)
FIO2: 1 %
MECHVT: 450 mL
O2 Saturation: 99.7 %
PEEP: 5 cmH2O
Patient temperature: 37
RATE: 18 resp/min
pCO2 arterial: 37 mmHg (ref 32–48)
pH, Arterial: 7.45 (ref 7.35–7.45)
pO2, Arterial: 151 mmHg — ABNORMAL HIGH (ref 83–108)

## 2022-03-23 LAB — BASIC METABOLIC PANEL
Anion gap: 8 (ref 5–15)
BUN: 30 mg/dL — ABNORMAL HIGH (ref 8–23)
CO2: 24 mmol/L (ref 22–32)
Calcium: 8.8 mg/dL — ABNORMAL LOW (ref 8.9–10.3)
Chloride: 103 mmol/L (ref 98–111)
Creatinine, Ser: 0.94 mg/dL (ref 0.44–1.00)
GFR, Estimated: >60 mL/min (ref >60)
Glucose, Bld: 132 mg/dL — ABNORMAL HIGH (ref 70–99)
Potassium: 5.4 mmol/L — ABNORMAL HIGH (ref 3.5–5.1)
Sodium: 135 mmol/L (ref 135–145)

## 2022-03-23 LAB — HEPATIC FUNCTION PANEL
ALT: 28 U/L (ref 0–44)
AST: 91 U/L — ABNORMAL HIGH (ref 15–41)
Albumin: 2.3 g/dL — ABNORMAL LOW (ref 3.5–5.0)
Alkaline Phosphatase: 570 U/L — ABNORMAL HIGH (ref 38–126)
Bilirubin, Direct: 3.9 mg/dL — ABNORMAL HIGH (ref 0.0–0.2)
Indirect Bilirubin: 2.7 mg/dL — ABNORMAL HIGH (ref 0.3–0.9)
Total Bilirubin: 6.6 mg/dL — ABNORMAL HIGH (ref 0.3–1.2)
Total Protein: 6.1 g/dL — ABNORMAL LOW (ref 6.5–8.1)

## 2022-03-23 LAB — URINALYSIS, COMPLETE (UACMP) WITH MICROSCOPIC
Glucose, UA: NEGATIVE mg/dL
Hgb urine dipstick: NEGATIVE
Ketones, ur: 5 mg/dL — AB
Nitrite: NEGATIVE
Protein, ur: 30 mg/dL — AB
Specific Gravity, Urine: 1.018 (ref 1.005–1.030)
pH: 5 (ref 5.0–8.0)

## 2022-03-23 LAB — MAGNESIUM: Magnesium: 2.5 mg/dL — ABNORMAL HIGH (ref 1.7–2.4)

## 2022-03-23 LAB — PROCALCITONIN: Procalcitonin: 1.09 ng/mL

## 2022-03-23 LAB — CBC WITH DIFFERENTIAL/PLATELET
Abs Immature Granulocytes: 0.12 10*3/uL — ABNORMAL HIGH (ref 0.00–0.07)
Basophils Absolute: 0.1 10*3/uL (ref 0.0–0.1)
Basophils Relative: 1 %
Eosinophils Absolute: 0 10*3/uL (ref 0.0–0.5)
Eosinophils Relative: 0 %
HCT: 41 % (ref 36.0–46.0)
Hemoglobin: 13.1 g/dL (ref 12.0–15.0)
Immature Granulocytes: 1 %
Lymphocytes Relative: 4 %
Lymphs Abs: 0.5 10*3/uL — ABNORMAL LOW (ref 0.7–4.0)
MCH: 38.1 pg — ABNORMAL HIGH (ref 26.0–34.0)
MCHC: 32 g/dL (ref 30.0–36.0)
MCV: 119.2 fL — ABNORMAL HIGH (ref 80.0–100.0)
Monocytes Absolute: 1.4 10*3/uL — ABNORMAL HIGH (ref 0.1–1.0)
Monocytes Relative: 12 %
Neutro Abs: 9.8 10*3/uL — ABNORMAL HIGH (ref 1.7–7.7)
Neutrophils Relative %: 82 %
Platelets: 315 10*3/uL (ref 150–400)
RBC: 3.44 MIL/uL — ABNORMAL LOW (ref 3.87–5.11)
RDW: 18.9 % — ABNORMAL HIGH (ref 11.5–15.5)
Smear Review: NORMAL
WBC: 11.9 10*3/uL — ABNORMAL HIGH (ref 4.0–10.5)
nRBC: 0 % (ref 0.0–0.2)

## 2022-03-23 LAB — GLUCOSE, CAPILLARY: Glucose-Capillary: 123 mg/dL — ABNORMAL HIGH (ref 70–99)

## 2022-03-23 LAB — SARS CORONAVIRUS 2 BY RT PCR: SARS Coronavirus 2 by RT PCR: NEGATIVE

## 2022-03-23 LAB — PHOSPHORUS: Phosphorus: 3.2 mg/dL (ref 2.5–4.6)

## 2022-03-23 LAB — TROPONIN I (HIGH SENSITIVITY)
Troponin I (High Sensitivity): 30 ng/L — ABNORMAL HIGH (ref <18)
Troponin I (High Sensitivity): 37 ng/L — ABNORMAL HIGH (ref <18)

## 2022-03-23 LAB — BRAIN NATRIURETIC PEPTIDE: B Natriuretic Peptide: 112.7 pg/mL — ABNORMAL HIGH (ref 0.0–100.0)

## 2022-03-23 MED ORDER — NOREPINEPHRINE 16 MG/250ML-% IV SOLN
0.0000 ug/min | INTRAVENOUS | Status: DC
  Filled 2022-03-23: qty 250

## 2022-03-23 MED ORDER — FENTANYL CITRATE PF 50 MCG/ML IJ SOSY: 25.0000 ug | PREFILLED_SYRINGE | INTRAMUSCULAR | Status: DC | PRN

## 2022-03-23 MED ORDER — LORAZEPAM 2 MG/ML IJ SOLN
2.0000 mg | INTRAMUSCULAR | Status: DC | PRN
  Administered 2022-03-23: 2 mg via INTRAVENOUS
  Filled 2022-03-23: qty 1

## 2022-03-23 MED ORDER — GLYCOPYRROLATE 0.2 MG/ML IJ SOLN: 0.2000 mg | INTRAMUSCULAR | Status: DC | PRN

## 2022-03-23 MED ORDER — POLYETHYLENE GLYCOL 3350 17 G PO PACK: 17.0000 g | PACK | Freq: Every day | ORAL | Status: DC

## 2022-03-23 MED ORDER — NOREPINEPHRINE 4 MG/250ML-% IV SOLN
INTRAVENOUS | Status: AC
  Filled 2022-03-23: qty 250

## 2022-03-23 MED ORDER — MIDAZOLAM HCL 2 MG/2ML IJ SOLN
INTRAMUSCULAR | Status: AC
  Filled 2022-03-23: qty 2

## 2022-03-23 MED ORDER — MIDAZOLAM HCL 2 MG/2ML IJ SOLN
1.0000 mg | INTRAMUSCULAR | Status: DC | PRN
Start: 2022-03-23 — End: 2022-03-23

## 2022-03-23 MED ORDER — IOHEXOL 350 MG/ML SOLN
75.0000 mL | Freq: Once | INTRAVENOUS | Status: AC | PRN
  Administered 2022-03-23: 75 mL via INTRAVENOUS

## 2022-03-23 MED ORDER — ENOXAPARIN SODIUM 40 MG/0.4ML IJ SOSY: 40.0000 mg | PREFILLED_SYRINGE | INTRAMUSCULAR | Status: DC

## 2022-03-23 MED ORDER — NOREPINEPHRINE 16 MG/250ML-% IV SOLN: 0.0000 ug/min | INTRAVENOUS | Status: DC

## 2022-03-23 MED ORDER — DOCUSATE SODIUM 50 MG/5ML PO LIQD: 100.0000 mg | Freq: Two times a day (BID) | ORAL | Status: DC | PRN

## 2022-03-23 MED ORDER — ACETAMINOPHEN 650 MG RE SUPP: 650.0000 mg | Freq: Four times a day (QID) | RECTAL | Status: DC | PRN

## 2022-03-23 MED ORDER — NOREPINEPHRINE 4 MG/250ML-% IV SOLN
2.0000 ug/min | INTRAVENOUS | Status: DC
  Administered 2022-03-23: 18 ug/min via INTRAVENOUS
  Filled 2022-03-23: qty 250

## 2022-03-23 MED ORDER — ACETAMINOPHEN 325 MG PO TABS: 650.0000 mg | ORAL_TABLET | Freq: Four times a day (QID) | ORAL | Status: DC | PRN

## 2022-03-23 MED ORDER — ROCURONIUM BROMIDE 10 MG/ML (PF) SYRINGE
100.0000 mg | PREFILLED_SYRINGE | Freq: Once | INTRAVENOUS | Status: AC
  Administered 2022-03-23: 100 mg via INTRAVENOUS

## 2022-03-23 MED ORDER — POLYVINYL ALCOHOL 1.4 % OP SOLN
1.0000 [drp] | Freq: Four times a day (QID) | OPHTHALMIC | Status: DC | PRN
  Filled 2022-03-23: qty 15

## 2022-03-23 MED ORDER — MORPHINE SULFATE (PF) 2 MG/ML IV SOLN
2.0000 mg | INTRAVENOUS | Status: DC | PRN
  Filled 2022-03-23: qty 1

## 2022-03-23 MED ORDER — ALBUTEROL SULFATE (2.5 MG/3ML) 0.083% IN NEBU: 2.5000 mg | INHALATION_SOLUTION | RESPIRATORY_TRACT | Status: DC | PRN

## 2022-03-23 MED ORDER — MIDAZOLAM HCL 2 MG/2ML IJ SOLN
2.0000 mg | Freq: Once | INTRAMUSCULAR | Status: AC
  Administered 2022-03-23: 2 mg via INTRAVENOUS

## 2022-03-23 MED ORDER — POLYETHYLENE GLYCOL 3350 17 G PO PACK
17.0000 g | PACK | Freq: Every day | ORAL | Status: DC | PRN
Start: 2022-03-23 — End: 2022-03-23

## 2022-03-23 MED ORDER — PANTOPRAZOLE 2 MG/ML SUSPENSION: 40.0000 mg | Freq: Every day | ORAL | Status: DC

## 2022-03-23 MED ORDER — MIDAZOLAM HCL 2 MG/2ML IJ SOLN: 1.0000 mg | INTRAMUSCULAR | Status: DC | PRN

## 2022-03-23 MED ORDER — MORPHINE BOLUS VIA INFUSION
5.0000 mg | INTRAVENOUS | Status: DC | PRN
  Administered 2022-03-23: 2 mg via INTRAVENOUS
  Administered 2022-03-23: 5 mg via INTRAVENOUS

## 2022-03-23 MED ORDER — ROCURONIUM BROMIDE 10 MG/ML (PF) SYRINGE
PREFILLED_SYRINGE | INTRAVENOUS | Status: AC
  Filled 2022-03-23: qty 10

## 2022-03-23 MED ORDER — NOREPINEPHRINE 4 MG/250ML-% IV SOLN
0.0000 ug/min | INTRAVENOUS | Status: DC
  Administered 2022-03-23: 5 ug/min via INTRAVENOUS

## 2022-03-23 MED ORDER — MORPHINE SULFATE (PF) 2 MG/ML IV SOLN
INTRAVENOUS | Status: AC
  Administered 2022-03-23: 4 mg via INTRAVENOUS
  Filled 2022-03-23: qty 1

## 2022-03-23 MED ORDER — DOCUSATE SODIUM 100 MG PO CAPS: 100.0000 mg | ORAL_CAPSULE | Freq: Two times a day (BID) | ORAL | Status: DC | PRN

## 2022-03-23 MED ORDER — FENTANYL CITRATE PF 50 MCG/ML IJ SOSY
25.0000 ug | PREFILLED_SYRINGE | INTRAMUSCULAR | Status: DC | PRN
  Administered 2022-03-23 (×2): 50 ug via INTRAVENOUS
  Filled 2022-03-23 (×2): qty 1

## 2022-03-23 MED ORDER — NOREPINEPHRINE 4 MG/250ML-% IV SOLN: 0.0000 ug/min | INTRAVENOUS | Status: DC

## 2022-03-23 MED ORDER — POLYVINYL ALCOHOL 1.4 % OP SOLN: 1.0000 [drp] | Freq: Four times a day (QID) | OPHTHALMIC | Status: DC | PRN

## 2022-03-23 MED ORDER — MORPHINE 100MG IN NS 100ML (1MG/ML) PREMIX INFUSION
0.0000 mg/h | INTRAVENOUS | Status: DC
  Administered 2022-03-23: 5 mg/h via INTRAVENOUS
  Filled 2022-03-23: qty 100

## 2022-03-23 MED ORDER — GLYCOPYRROLATE 0.2 MG/ML IJ SOLN
0.2000 mg | INTRAMUSCULAR | Status: DC | PRN
  Administered 2022-03-23: 0.2 mg via INTRAVENOUS
  Filled 2022-03-23: qty 1

## 2022-03-23 MED ORDER — DOCUSATE SODIUM 50 MG/5ML PO LIQD: 100.0000 mg | Freq: Two times a day (BID) | ORAL | Status: DC

## 2022-03-23 MED ORDER — SODIUM CHLORIDE 0.9 % IV SOLN: 250.0000 mL | INTRAVENOUS | Status: DC

## 2022-03-23 MED ORDER — GLYCOPYRROLATE 1 MG PO TABS: 1.0000 mg | ORAL_TABLET | ORAL | Status: DC | PRN

## 2022-03-23 NOTE — Progress Notes (Signed)
Chaplain responded to request for EOL support for family and pt.  Chaplain provided emotional and spiritual support to family, asked about spiritual background and needs. Pt had been Mormon, but most recently [employer], but no [employer]. Discussed possibilities of support by chaplain or outside priest. Family requested prayer with [employer] by chaplain.  Chaplain provided prayer/blessing in Spanish (family is bilingual, pt is Spanish-speaking only).  Also provided hospitality. Two daughters and two grandchildren are present.  Family has an open video call with third daughter in [place].  Daughters especially are tearful.  Family appears to be accepting/understanding of pt's status and are grieving appropriately.   Please contact as needed for ongoing support.   [place]us [place], [place] Pager:  [phone]    03/19/2022 2300  Clinical Encounter Type  Visited With Patient and family together  Visit Type Patient actively dying  Referral From Nurse  Consult/Referral To Chaplain  Spiritual Encounters  Spiritual Needs Prayer;Ritual  Stress Factors  Patient Stress Factors Major life changes;Health changes  Family Stress Factors Loss;Major life changes

## 2022-03-23 NOTE — ED Notes (Signed)
Contacted RT to come suction PT

## 2022-03-23 NOTE — Progress Notes (Signed)
Patient extubated to comfort care and placed on 4L [place] per order.

## 2022-03-23 NOTE — ED Triage Notes (Signed)
Low oxygen sats , pancreas cancer , 103 blood sugar, tacky , lethargic

## 2022-03-23 NOTE — ED Notes (Signed)
While in CT pt's levophed drip ruptured in the IV pump due to the pressure of the CT scan being put through the same line. While attempting to reconnect and restart the infusion patients heart rate dropped to 35-45 BMP and blood pressure critically low. Pt also had a bowel movement. Writer was able to get levophed reconnected and transported the patient to the ICU rapidly. Upon arrival to ICU writer was met by multiple other staff members who assisted patient to the bed and applied their monitors to the patient. Prior to leaving the ICU patient's blood pressure had stabilized. Report given to Merrily Pew, RN with no additional needs or questions for Probation officer.

## 2022-03-23 NOTE — Progress Notes (Signed)
[place] Progress Note Patient Name: Rachel Montoya DOB: Mar 16, 1951 MRN: [id number]   Date of Service  02/26/2022  HPI/Events of Note  71/F with history of metastatic pancreaitc CA, who presents with shortness of breath. In the ED, she was unresponsive, tachycardic, hypotensive. She was intubated in the ED and subsequently transferred to the ICU for further management.   eICU Interventions  - Pt admitted to ICU  - Initially intubated, but family had decided to change code status from DNR to comfort measures given her poor overall prognosis - Pt no terminally extubated. With PRN ativan and morphine ordered         [place] 03/13/2022, 10:07 PM

## 2022-03-23 NOTE — IPAL (Signed)
  Interdisciplinary Goals of Care Family Meeting   Date carried out: 02/26/2022  Location of the meeting: Unit with 3rd daughter on phone  Member's involved: Nurse Practitioner and Family Member or next of kin  Durable Power of Attorney or acting medical decision maker: All three daughters present    Discussion: We discussed goals of care for [facility] .  The patient has been declining due to metastatic pancreatic cancer with a recent admission to [facility] within the week, arriving to the hospital unresponsive in acute hypoxic respiratory failure. Imaging reveals recurrent suspected malignant large bilateral pleural effusions with compressive atelectasis. We discussed that previously about a week ago she declined pleural catheter placement at [facility]. We discussed her terminal illness and grave prognosis including the recommendation from the [facility] oncologist on call, to focus on comfort. We discussed her other co-morbidities including the recent Heart failure diagnosis. Since intubation the patient has become alert, tearful and requesting to come off the ventilator via hand gestures and writing to daughters who are now bedside. PCCM recommended to transition to comfort measures at this time. This process was discussed at length, all questions and concerns answered. All three daughters in agreement and the patient was transitioned to comfort measures with family bedside.  Code status: Full DNR  Disposition: In-patient comfort care  Time spent for the meeting: 30 minutes    Huel Cote [place], NP  03/26/2022, 10:03 PM

## 2022-03-23 NOTE — H&P (Signed)
NAME:  Rachel Montoya, MRN:  [id number], DOB:  [birth year], LOS: 0 ADMISSION DATE:  03/19/2022, CONSULTATION DATE:  03/13/2022 REFERRING MD:  Dr. Cinda Quest, CHIEF COMPLAINT:  Shortness of Breath   History of Present Illness:  71 yo F presenting to [facility] ED from home on 03/13/2022 via EMS with complaints of progressive dyspnea & hypoxia since her recent discharge from [facility] on 03/20/22. History provided by daughter, who she lives with that is bedside and chart review.   Patient is followed by Dr. Fanny Skates at [facility] and last seen in clinic on 03/13/22. Patient last received Cycle 6 Day 15 of Abraxane/Gemzar/OBI on 02/13/22. Patient was due to receive next dose on 02/27/22 however this was held due to worsening LV function and acute HFrEF requiring [facility] admission. She was discharged on 03/08/22 and at her last oncology appointment. There was discussion at this last clinic appointment on 03/13/22 that further chemo may not be appropriate. The patient was then again admitted to [facility] on 03/17/22 with progressive acute hypoxic respiratory failure s/t bilateral malignant pleural effusions requiring multiple intervention with thoracentesis. A pleural catheter was offered, but declined by the patient at that time. She was optimized and discharged on 03/20/22. At home since discharge the patient has been requiring chronic O2 of 4 L [place]. Per the daughter she has becomes increasingly dyspneic and hypoxic, with SpO2 levels in the 70's on 6 L [place], first only with exertion and then at rest as well. Earlier today on 03/16/2022 the patient became less responsive and EMS was called.  ED course: Upon arrival to ED patient was unresponsive, tachycardic, hypoxic & hypotensive. SpO2 in the 80's on a non rebreather and was placed on BIPAP while being prepped for intubation and mechanical ventilation. Lab work significant for mild hyperkalemia, Transaminitis and elevated bilirubin, mild leukocytosis. Medications given: IV contrast, versed, fentanyl,  levophed drip started Initial Vitals: 98.9, 130, 20, 94/37 & 80% on 15 L NRB Significant labs: (Labs/ Imaging personally reviewed) I, Domingo Pulse Rust-Chester, AGACNP-BC, personally viewed and interpreted this ECG. EKG: ordered/pending Chemistry: Na+: 135, K+: 5.4, BUN/Cr.: 30/0.94, Serum CO2/ AG: 24/8 Hematology: WBC: 11.9, Hgb: 13.1,  Troponin: 30 > 37, BNP: 112.7, Lactic/ PCT: pending, COVID-19: negative ABG: 7.45/ 37/ 151/ 25.7  CXR 03/17/2022: small to moderate bilateral pleural effusions R > L. Central pulmonary vessels are prominent suggesting CHF. CT head wo contrast: pending CT angio chest w/ wo contrast: pending  PCCM consulted for admission due to acute on chronic hypoxic respiratory failure requiring emergent intubation and mechanical ventilatory support.  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 Patient also in shock on vasopressor support.  Interim History / Subjective:  Patient intubated and sedated with daughter bedside  Objective   Blood pressure 113/62, pulse 77, temperature 98.5 F (36.9 C), temperature source Bladder, resp. rate 20, height '5\' 3"'$  (1.6 m), weight 81.6 kg, SpO2 100 %.    Vent Mode: AC FiO2 (%):  [70 %-100 %] 70 % Set Rate:  [18 bmp] 18 bmp Vt Set:  [450 mL] 450 mL PEEP:  [5 cmH20] 5  cmH20  No intake or output data in the 24 hours ending 03/16/2022 1955 Filed Weights   03/21/2022 1825  Weight: 81.6 kg    Examination: General: Adult  female, critically ill, lying in bed intubated & sedated requiring mechanical ventilation, NAD HEENT: MM pink/moist, anicteric, atraumatic, neck supple Neuro: intubated & sedated commands, PERRL +3 CV: s1s2 RRR, NSR on monitor, no r/m/g Pulm: Regular, non labored on PRVC 100% & PEEP 5, breath sounds coarse/rhonchi-BUL & diminished-BLL GI: soft, flat, bs x 4 GU: foley in place with clear amber urine Skin: limited exam, no rashes/lesions noted Extremities: warm/dry, pulses + 2 R/P, *** edema noted  Resolved Hospital Problem list     Assessment & Plan:  Acute Hypoxic Respiratory Failure secondary to / in the setting of PMHx: *** - Ventilator settings: PRVC  6***8 mL/kg, *** FiO2, *** PEEP, continue ventilator support & lung protective strategies - Wean PEEP & FiO2 as tolerated, maintain SpO2 > 90%*** - Head of bed elevated 30 degrees, VAP protocol in place - Plateau pressures less than 30 cm H20  - Intermittent chest x-ray & ABG PRN*** - Daily WUA with SBT as tolerated  - Ensure adequate pulmonary hygiene  - F/u cultures, trend PCT - Continue CAP/***Aspiration Pna coverage cefepime/ *** unasyn - Steroids initiated: solu-medrol *** mg BID /*** - Budesonide inhaler***nebs BID, bronchodilators PRN - PAD protocol in place: continue Fentanyl***Dilaudid drip***IVP & Propofol***Versed drip***IVP     Best Practice (right click and "Reselect all SmartList Selections" daily)  Diet/type: NPO w/ meds via tube DVT prophylaxis: LMWH GI prophylaxis: PPI Lines: yes and it is still needed- port Foley:  Yes, and it is still needed Code Status:  {Code Status:26939} Last date of multidisciplinary goals of care discussion [***]  Labs   CBC: Recent Labs  Lab 03/27/2022 1740  WBC 11.9*  NEUTROABS 9.8*  HGB 13.1  HCT 41.0  MCV 119.2*  PLT 518    Basic Metabolic Panel: Recent Labs  Lab 03/22/2022 1901  NA 135  K 5.4*  CL 103  CO2 24  GLUCOSE 132*  BUN 30*  CREATININE 0.94  CALCIUM  8.8*   GFR: Estimated Creatinine Clearance: 53.9 mL/min (by C-G formula based on SCr of 0.94 mg/dL). Recent Labs  Lab 03/18/2022 1740  WBC 11.9*    Liver Function Tests: Recent Labs  Lab 03/22/2022 1901  AST 91*  ALT 28  ALKPHOS 570*  BILITOT 6.6*  PROT 6.1*  ALBUMIN 2.3*   No results for input(s): "LIPASE", "AMYLASE" in the last 168 hours. No results for input(s): "AMMONIA" in the last 168 hours.  ABG    Component Value Date/Time   PHART 7.45 03/28/2022 1742   PCO2ART 37 02/26/2022 1742   PO2ART 151 (H) 03/26/2022 1742   HCO3 25.7 03/10/2022 1742   O2SAT 99.7 03/21/2022 1742     Coagulation Profile: No results for input(s): "INR", "PROTIME" in the last 168 hours.  Cardiac Enzymes: No results for input(s): "CKTOTAL", "CKMB", "CKMBINDEX", "TROPONINI" in the last 168 hours.  HbA1C: No results found for: "HGBA1C"  CBG: No results for input(s): "GLUCAP" in the last 168 hours.  Review of Systems:   UTA- patient intubated and sedated  Past Medical History:  She,  has no past medical history on file.   Surgical History:   Past Surgical History:  Procedure Laterality Date   ERCP N/A 06/26/2021   Procedure: ENDOSCOPIC RETROGRADE CHOLANGIOPANCREATOGRAPHY (ERCP);  Surgeon: Lucilla Lame, MD;  Location[facility] ENDOSCOPY;  Service: Endoscopy;  Laterality: N/A;   IR BILIARY DRAIN PLACEMENT WITH CHOLANGIOGRAM  06/28/2021     Social History:   reports that she does not currently use alcohol.   Family History:  Her family history is not on file.   Allergies No Known Allergies   Home Medications  Prior to Admission medications   Medication Sig Start Date End Date Taking? Authorizing Provider  doxycycline (MONODOX) 100 MG capsule Take 100 mg by mouth 2 (two) times daily. 02/27/22  Yes [provider]  famotidine (PEPCID) 20 MG tablet Take 20 mg by mouth daily. 03/22/22  Yes [provider]  folic acid (FOLVITE) 1 MG tablet Take 1 mg by mouth daily.  02/27/22  Yes [provider]  furosemide (LASIX) 20 MG tablet Take 20 mg by mouth 2 (two) times daily. 03/22/22  Yes [provider]  metoprolol succinate (TOPROL-XL) 25 MG 24 hr tablet Take 25 mg by mouth daily. 03/08/22  Yes [provider]  Potassium Chloride ER 20 MEQ TBCR Take 1 tablet by mouth 2 (two) times daily. 02/27/22  Yes [provider]  spironolactone (ALDACTONE) 25 MG tablet Take 12.5 mg by mouth daily. 03/08/22  Yes [provider]  torsemide (DEMADEX) 20 MG tablet Take 20 mg by mouth 2 (two) times daily. 03/08/22  Yes [provider]  valsartan (DIOVAN) 40 MG tablet Take 20 mg by mouth daily. 03/08/22  Yes [provider]  ondansetron (ZOFRAN) 4 MG tablet Take 4 mg by mouth every 8 (eight) hours as needed. 03/20/22   [provider]  prochlorperazine (COMPAZINE) 10 MG tablet Take 10 mg by mouth every 6 (six) hours as needed. 02/27/22   [provider]     Critical care time: ***       Domingo Pulse Rust-Chester, AGACNP-BC Acute Care Nurse Practitioner [facility]   [phone] / [phone] Please see Amion for pager details.

## 2022-03-23 NOTE — ED Provider Notes (Signed)
[facility] Provider Note    Event Date/Time   First MD Initiated Contact with Patient 03/22/2022 1638     (approximate)   History   Shortness of Breath (Pancreas cancer, sob, o2 sats 70's )   HPI  Rachel Montoya is a 71 y.o. female with a history of pancreatic carcinoma and lung metastasis comes in with increasing shortness of breath.  On arrival here she is unresponsive tachycardic hypotensive O2 sats are in the 80s on 100% nonrebreather.  While we are getting ready to intubate her we put her on BiPAP this brings her sats up into the low 90s.  She is then intubated by me using 2 of Versed and 1/kg of rock at a estimated weight of 100 kg with a 7 ET tube under direct vision using the glide scope I watched the tube go through the cords.  Patient with good bilateral breath sounds and on the stomach 22 at the lips good color change on the monitor.  Patient is getting fluids her blood pressure is coming up her O2 sats are coming up we will continue to evaluate her.    Past medical history:   Diagnoses   Shortness of breath   Hypoalbuminemia   Pancreatic adenocarcinoma (CMS-HCC)   RV (right ventricular) mural thrombus   Pleural effusion on right   Acute respiratory failure with hypoxia (CMS-HCC)   History of pleural effusion   Acute on chronic respiratory failure with hypoxia (CMS-HCC)   Acute on chronic HFrEF (heart failure with reduced ejection fraction) (CMS-   Physical Exam   Triage Vital Signs: ED Triage Vitals  Enc Vitals Group     BP 03/27/2022 1624 (!) 94/37     Pulse Rate 03/01/2022 1621 (!) 130     Resp 03/22/2022 1621 20     Temp 03/11/2022 1621 98.9 F (37.2 C)     Temp Source 03/09/2022 1621 Oral     SpO2 03/22/2022 1621 90 %     Weight --      Height --      Head Circumference --      Peak Flow --      Pain Score --      Pain Loc --      Pain Edu? --      Excl. in [place]? --     Most recent vital signs: Vitals:   03/07/2022 1930 03/21/2022 1940   BP: 126/67 113/62  Pulse: 82 77  Resp: 20 20  Temp:    SpO2: 100% 100%    General: Unresponsive CV:  Good peripheral perfusion.  Heart regular rate and rhythm no audible murmurs Resp:  Increased effort with retractions very faint breath sounds Abd:  No obvious distention belly is soft    ED Results / Procedures / Treatments   Labs (all labs ordered are listed, but only abnormal results are displayed) Labs Reviewed  BLOOD GAS, ARTERIAL - Abnormal; Notable for the following components:      Result Value   pO2, Arterial 151 (*)    All other components within normal limits  CBC WITH DIFFERENTIAL/PLATELET - Abnormal; Notable for the following components:   WBC 11.9 (*)    RBC 3.44 (*)    MCV 119.2 (*)    MCH 38.1 (*)    RDW 18.9 (*)    Neutro Abs 9.8 (*)    Lymphs Abs 0.5 (*)    Monocytes Absolute 1.4 (*)    Abs Immature  Granulocytes 0.12 (*)    All other components within normal limits  BASIC METABOLIC PANEL - Abnormal; Notable for the following components:   Potassium 5.4 (*)    Glucose, Bld 132 (*)    BUN 30 (*)    Calcium 8.8 (*)    All other components within normal limits  HEPATIC FUNCTION PANEL - Abnormal; Notable for the following components:   Total Protein 6.1 (*)    Albumin 2.3 (*)    AST 91 (*)    Alkaline Phosphatase 570 (*)    Total Bilirubin 6.6 (*)    Bilirubin, Direct 3.9 (*)    Indirect Bilirubin 2.7 (*)    All other components within normal limits  BRAIN NATRIURETIC PEPTIDE - Abnormal; Notable for the following components:   B Natriuretic Peptide 112.7 (*)    All other components within normal limits  URINALYSIS, COMPLETE (UACMP) WITH MICROSCOPIC - Abnormal; Notable for the following components:   Color, Urine AMBER (*)    APPearance CLOUDY (*)    Bilirubin Urine SMALL (*)    Ketones, ur 5 (*)    Protein, ur 30 (*)    Leukocytes,Ua MODERATE (*)    Bacteria, UA MANY (*)    All other components within normal limits  TROPONIN I (HIGH  SENSITIVITY) - Abnormal; Notable for the following components:   Troponin I (High Sensitivity) 30 (*)    All other components within normal limits  SARS CORONAVIRUS 2 BY RT PCR  CBC  MAGNESIUM  PHOSPHORUS  COMPREHENSIVE METABOLIC PANEL  PROCALCITONIN  PROCALCITONIN  MAGNESIUM  PHOSPHORUS  TROPONIN I (HIGH SENSITIVITY)     EKG  EKG read and interpreted by me shows sinus tach at a rate of 142 apparent normal axis wide QRS complexes   RADIOLOGY Chest x-ray read by radiology reviewed and interpreted by me shows bilateral small pleural effusions with some increased vascular markings possibly CHF.  Patient has a history of CHF.  Unfortunately patient is hypotensive and we are now giving her fluids.   PROCEDURES:  Critical Care performed: Critical care time 40 minutes.  This includes time spent intubating the patient and monitoring the response to the fluids and the pressors.  Additionally I spent some time speaking with the patient's daughter and with the NP from the ICU and I also spent probably at least 5 to 6 minutes talking with the [facility] intake on the phone and then a few more minutes talking to a group doctor about the patient's prognosis which sounds quite poor.  I also spent a good bit of time reviewing her old records.  Procedures   MEDICATIONS ORDERED IN ED: Medications  rocuronium bromide 100 MG/10ML SOSY (  Not Given 03/01/2022 1634)  midazolam (VERSED) 2 MG/2ML injection (  Not Given 03/09/2022 1633)  norepinephrine (LEVOPHED) 4-5 MG/250ML-% infusion SOLN (  Not Given 03/01/2022 1646)  docusate sodium (COLACE) capsule 100 mg (has no administration in time range)  polyethylene glycol (MIRALAX / GLYCOLAX) packet 17 g (has no administration in time range)  enoxaparin (LOVENOX) injection 40 mg (has no administration in time range)  pantoprazole (PROTONIX) 2 mg/mL oral suspension 40 mg (has no administration in time range)  0.9 %  sodium chloride infusion (250 mLs Intravenous Not  Given 02/28/2022 2005)  norepinephrine (LEVOPHED) '4mg'$  in 248m (0.016 mg/mL) premix infusion (18 mcg/min Intravenous New Bag/Given 03/05/2022 2004)  docusate (COLACE) 50 MG/5ML liquid 100 mg (has no administration in time range)  polyethylene glycol (MIRALAX / GLYCOLAX) packet  17 g (has no administration in time range)  fentaNYL (SUBLIMAZE) injection 25 mcg (has no administration in time range)  fentaNYL (SUBLIMAZE) injection 25-100 mcg (50 mcg Intravenous Given 03/25/2022 2041)  midazolam (VERSED) injection 1 mg (has no administration in time range)  midazolam (VERSED) injection 1 mg (has no administration in time range)  iohexol (OMNIPAQUE) 350 MG/ML injection 75 mL (has no administration in time range)  midazolam (VERSED) 2 MG/2ML injection (  Given 03/22/2022 1632)  rocuronium bromide 100 MG/10ML SOSY (  Given 03/15/2022 1632)  rocuronium bromide 10 mg/mL (PF) syringe (100 mg Intravenous Given 03/26/2022 1632)  midazolam (VERSED) injection 2 mg (2 mg Intravenous Given 03/22/2022 1632)     IMPRESSION / MDM / ASSESSMENT AND PLAN / ED COURSE  I reviewed the triage vital signs and the nursing notes. Patient doing much better on pressors.  We have paged ICU twice they have an answer yet.  We are waiting for a third page.  I expect that they are quite busy upstairs.  Differential diagnosis includes, but is not limited to, patient could be having an infection or CHF or even an MI or blood clot from the mural thrombus that she has.  I discussed all this with the ICU and they will initiate further work-up upstairs now that the patient is temporarily stable on her pressors.  Patient's presentation is most consistent with acute presentation with potential threat to life or bodily function.  The patient is on the cardiac monitor to evaluate for evidence of arrhythmia and/or significant heart rate changes no arrhythmias were seen.  The patient's QRS seems to be somewhat wider than previously though.   FINAL CLINICAL  IMPRESSION(S) / ED DIAGNOSES   Final diagnoses:  Hypoxia  Hypotension, unspecified hypotension type  Acute on chronic congestive heart failure, unspecified heart failure type ([place])     Rx / DC Orders   ED Discharge Orders     None        Note:  This document was prepared using Dragon voice recognition software and may include unintentional dictation errors.   Nena Polio, MD 03/12/2022 2044

## 2022-03-24 ENCOUNTER — Encounter: Payer: Self-pay | Admitting: Pulmonary Disease

## 2022-03-24 DIAGNOSIS — R7401 Elevation of levels of liver transaminase levels: Secondary | ICD-10-CM

## 2022-03-24 DIAGNOSIS — Z978 Presence of other specified devices: Secondary | ICD-10-CM

## 2022-03-24 DIAGNOSIS — Z9911 Dependence on respirator [ventilator] status: Secondary | ICD-10-CM

## 2022-03-29 NOTE — Progress Notes (Signed)
Chaplain returned following pt death. Provided grief support, prayer and orientation to process.   Please call as need may arise.   Minus Liberty, Chaplain Pager:  [phone]

## 2022-03-29 NOTE — Progress Notes (Signed)
Pt expired w/ family at bedside at 0001. Pt did not appear to be in any acute distress before passing and family was at bedside to provide support since extubation. Family still at bedside and grieving appropriately with chaplain present. All questions answered and emotional support provided. Family provided w/ funeral home list.

## 2022-03-29 NOTE — Death Summary Note (Signed)
DEATH SUMMARY   Patient Details  Name: Rachel Montoya MRN: [id number] DOB: Mar 15, 1951  Admission/Discharge Information   Admit Date:  April 03, 2022  Date of Death: Date of Death: 2022-04-04  Time of Death: Time of Death: 0001  Length of Stay: 1  Referring Physician: [facility] [place]   Reason(s) for Hospitalization  Acute on Chronic hypoxia respiratory failure requiring emergent intubation and mechanical ventilatory support.  Diagnoses  Preliminary cause of death: Metastatic Pancreatic Cancer Secondary Diagnoses (including complications and co-morbidities):  Principal Problem:   Acute on chronic respiratory failure (HCC) Active Problems:   Pancreatic cancer (HCC)   Hypoalbuminemia   Endotracheally intubated   Transaminitis   On mechanically assisted ventilation [facility])   Brief Hospital Course (including significant findings, care, treatment, and services provided and events leading to death)  Rachel Montoya is a 71 y.o. year old female who  presented to [facility] ED from home on 2022/04/03 via EMS with complaints of progressive dyspnea & hypoxia since her recent discharge from [place] on 03/20/22. History provided by daughter, who she lives with that is bedside and chart review.    Patient is followed by Dr. Fanny Skates at [facility] and last seen in clinic on 03/13/22. Patient last received Cycle 6 Day 15 of Abraxane/Gemzar/OBI on 02/13/22. Patient was due to receive next dose on 02/27/22 however this was held due to worsening LV function and acute HFrEF requiring [facility] admission. She was discharged on 03/08/22 and at her last oncology appointment. There was discussion at this last clinic appointment on 03/13/22 that further chemo may not be appropriate. The patient was then again admitted to [facility] on 03/17/22 with progressive acute hypoxic respiratory failure s/t bilateral malignant pleural effusions requiring multiple intervention with thoracentesis. A pleural catheter was offered, but declined  by the patient at that time. She was optimized and discharged on 03/20/22. At home since discharge the patient has been requiring chronic O2 of 4 L [place]. Per the daughter she has becomes increasingly dyspneic and hypoxic, with SpO2 levels in the 70's on 6 L [place], first only with exertion and then at rest as well. Earlier today on Apr 03, 2022 the patient became less responsive and EMS was called. ED course: Upon arrival to ED patient was unresponsive, tachycardic, hypoxic & hypotensive. SpO2 in the 80's on a non rebreather and was placed on BIPAP while being prepped for intubation and mechanical ventilation. Lab work significant for mild hyperkalemia, Transaminitis and elevated bilirubin, mild leukocytosis. CXR concerning for re-accumulated bilateral malignant pleural effusions. EDP spoke with [facility] oncologist on call who reviewed the patient's case. He was in agreement with EDP & PCCM with recommendations for transition to comfort measures. CT head without abnormality. CTa chest negative for PE, demonstrating large bilateral pleural effusions with compressive atelectasis as well as ground glass opacities and nodular densities measuring up to 7 mm. While in CT patient's HR dropped into the 40's with pauses. Patient requiring high doses of levophed for BP support. Upon arrival to ICU patient more awake/alert, tearful telling family through gestures and writing that she wanted the ETT removed. After lengthy [place] discussion, decision made by 3 daughters to extubate and transition to comfort measures only. Patient extubated with family present, passing away about 2 hours later with multiple family members & chaplain bedside.  Pertinent Labs and Studies  Significant Diagnostic Studies CT Angio Chest Pulmonary Embolism (PE) W or WO Contrast  Result Date: 2022-04-03 CLINICAL DATA:  High probability for PE. History of pancreatic cancer and  lung metastases. Shortness of breath. EXAM: CT ANGIOGRAPHY CHEST WITH CONTRAST  TECHNIQUE: Multidetector CT imaging of the chest was performed using the standard protocol during bolus administration of intravenous contrast. Multiplanar CT image reconstructions and MIPs were obtained to evaluate the vascular anatomy. RADIATION DOSE REDUCTION: This exam was performed according to the departmental dose-optimization program which includes automated exposure control, adjustment of the mA and/or kV according to patient size and/or use of iterative reconstruction technique. CONTRAST:  56m OMNIPAQUE IOHEXOL 350 MG/ML SOLN COMPARISON:  MRI of the abdomen 06/27/2021. CT of the abdomen and pelvis 06/26/2021. FINDINGS: Cardiovascular: Satisfactory opacification of the pulmonary arteries to the segmental level. No evidence of pulmonary embolism. Heart is mildly enlarged. No pericardial effusion. Right chest port catheter tip ends in the right atrium. Mediastinum/Nodes: No enlarged mediastinal, hilar, or axillary lymph nodes. Thyroid gland, trachea, and esophagus demonstrate no significant findings. Enteric tube is seen throughout the esophagus. Lungs/Pleura: There are large bilateral pleural effusions with compressive atelectasis of the bilateral lower lobes and right middle lobe. There are patchy and nodular ground-glass opacities throughout both upper lobes in the right middle lobe. Nodular densities measure up to 7 mm. There is some atelectasis in the lingula. No pneumothorax. The tip of the endotracheal tube is 2 cm above the carina. Upper Abdomen: Common bile duct stent present. Enteric tube extends into the stomach, but its distal tip is not included on this exam. Musculoskeletal: No chest wall abnormality. No acute or significant osseous findings. Review of the MIP images confirms the above findings. IMPRESSION: 1. No evidence for pulmonary embolism. 2. Large bilateral pleural effusions. 3. Compressive atelectasis in the bilateral lower lobes and right middle lobe. 4. Diffuse patchy and ill-defined  nodular ground-glass opacities throughout the aerated lungs worrisome for infectious/inflammatory process. Nodular densities measure up to 7 mm. Non-contrast chest CT at 3-6 months is recommended. If nodules persist, subsequent management will be based upon the most suspicious nodule(s). This recommendation follows the consensus statement: Guidelines for Management of Incidental Pulmonary Nodules Detected on CT Images: From the Fleischner Society 2017; Radiology 2017; 284:228-243. 5. Mild cardiomegaly. Electronically Signed   By: ARonney AstersM.D.   On: 03/13/2022 21:29   CT HEAD WO CONTRAST (5MM)  Result Date: 03/10/2022 CLINICAL DATA:  Mental status change, unknown cause EXAM: CT HEAD WITHOUT CONTRAST TECHNIQUE: Contiguous axial images were obtained from the base of the skull through the vertex without intravenous contrast. RADIATION DOSE REDUCTION: This exam was performed according to the departmental dose-optimization program which includes automated exposure control, adjustment of the mA and/or kV according to patient size and/or use of iterative reconstruction technique. COMPARISON:  06/26/2021 FINDINGS: Brain: No acute intracranial abnormality. Specifically, no hemorrhage, hydrocephalus, mass lesion, acute infarction, or significant intracranial injury. Vascular: No hyperdense vessel or unexpected calcification. Skull: No acute calvarial abnormality. Sinuses/Orbits: No acute findings Other: None IMPRESSION: No acute intracranial abnormality. Electronically Signed   By: KRolm BaptiseM.D.   On: 03/02/2022 21:21   DG Chest Portable 1 View  Result Date: 03/01/2022 CLINICAL DATA:  Difficulty breathing EXAM: PORTABLE CHEST 1 VIEW COMPARISON:  06/26/2021 FINDINGS: Transverse diameter of heart is slightly increased. Small to moderate bilateral pleural effusions are seen, more so on the right side. Central pulmonary vessels are prominent. Tip of endotracheal tube is 3.4 cm above the carina. Tip of right IJ  chest port is seen in superior vena cava. Enteric tube is noted traversing the esophagus. IMPRESSION: Small-to-moderate bilateral pleural effusions, more so on the  right side. Central pulmonary vessels are prominent suggesting CHF. Electronically Signed   By: Elmer Picker M.D.   On: 03/12/2022 16:56    Microbiology Recent Results (from the past 240 hour(s))  SARS Coronavirus 2 by RT PCR (hospital order, performed in [facility] hospital lab) *cepheid single result test* Anterior Nasal Swab     Status: None   Collection Time: 03/05/2022  5:40 PM   Specimen: Anterior Nasal Swab  Result Value Ref Range Status   SARS Coronavirus 2 by RT PCR NEGATIVE NEGATIVE Final    Comment: (NOTE) SARS-CoV-2 target nucleic acids are NOT DETECTED.  The SARS-CoV-2 RNA is generally detectable in upper and lower respiratory specimens during the acute phase of infection. The lowest concentration of SARS-CoV-2 viral copies this assay can detect is 250 copies / mL. A negative result does not preclude SARS-CoV-2 infection and should not be used as the sole basis for treatment or other patient management decisions.  A negative result may occur with improper specimen collection / handling, submission of specimen other than nasopharyngeal swab, presence of viral mutation(s) within the areas targeted by this assay, and inadequate number of viral copies (<250 copies / mL). A negative result must be combined with clinical observations, patient history, and epidemiological information.  Fact Sheet for Patients:   https://www.patel.info/  Fact Sheet for Healthcare Providers: https://hall.com/  This test is not yet approved or  cleared by the [place] FDA and has been authorized for detection and/or diagnosis of SARS-CoV-2 by FDA under an Emergency Use Authorization (EUA).  This EUA will remain in effect (meaning this test can be used) for the duration of  the COVID-19 declaration under Section 564(b)(1) of the Act, 21 U.S.C. section 360bbb-3(b)(1), unless the authorization is terminated or revoked sooner.  Performed at [facility], [address] [place], [place] [address]     Lab Basic Metabolic Panel: Recent Labs  Lab 03/11/2022 1901 03/11/2022 2030  NA 135  --   K 5.4*  --   CL 103  --   CO2 24  --   GLUCOSE 132*  --   BUN 30*  --   CREATININE 0.94  --   CALCIUM 8.8*  --   MG  --  2.5*  PHOS  --  3.2   Liver Function Tests: Recent Labs  Lab 03/06/2022 1901  AST 91*  ALT 28  ALKPHOS 570*  BILITOT 6.6*  PROT 6.1*  ALBUMIN 2.3*   No results for input(s): "LIPASE", "AMYLASE" in the last 168 hours. No results for input(s): "AMMONIA" in the last 168 hours. CBC: Recent Labs  Lab 03/07/2022 1740  WBC 11.9*  NEUTROABS 9.8*  HGB 13.1  HCT 41.0  MCV 119.2*  PLT 315   Cardiac Enzymes: No results for input(s): "CKTOTAL", "CKMB", "CKMBINDEX", "TROPONINI" in the last 168 hours. Sepsis Labs: Recent Labs  Lab 03/07/2022 1740 03/05/2022 2030  PROCALCITON  --  1.09  WBC 11.9*  --     Procedures/Operations  03/08/2022: ETT placement   Daffney Greenly L Rust-Chester 12-Apr-2022, 12:42 AM  Domingo Pulse Rust-Chester, AGACNP-BC Acute Care Nurse Practitioner [facility]   [phone] / [phone] Please see Amion for pager details.

## 2022-03-29 DEATH — deceased

## 2023-09-21 IMAGING — MR MR ABDOMEN WO/W CM MRCP
20 of 21 series · 45 of 48 positions shown · IV contrast (gadavist)
Comparison: Comparison is made with prior CT evaluation [DATE]

CLINICAL DATA: A 70-year-old female presenting with jaundice with
suspected pancreatic mass and biliary obstruction

EXAM:
MRI ABDOMEN WITHOUT AND WITH CONTRAST (INCLUDING MRCP)
TECHNIQUE: Multiplanar multisequence MR imaging of the abdomen was performed
both before and after the administration of intravenous contrast.
Heavily T2-weighted images of the biliary and pancreatic ducts were
obtained, and three-dimensional MRCP images were rendered by post
processing.
CONTRAST:  10mL GADAVIST GADOBUTROL 1 MMOL/ML IV SOLN

[Series 3: T2 · coronal · 6.0mm · 1.19mm/px · 1 of 31 slices shown (1 of 2)]
[im 1/31]
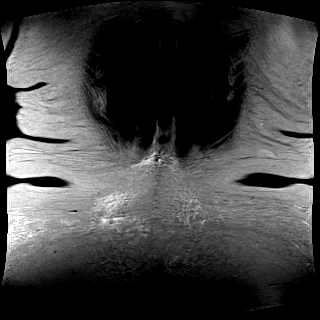

[Series 4: T2 · axial · 6.0mm · 1.19mm/px · 1 of 32 slices shown (2 of 2)]
[im 1/32]
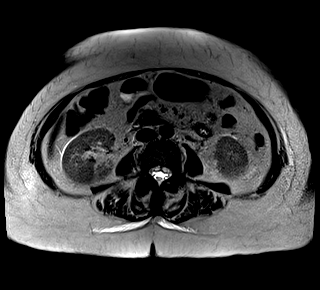

[Series 5: in & out · axial · 3.0mm · 1.19mm/px · z∈[-45,+168]mm · 2 of 72 slices shown (1 of 2)]
[im 1/72]
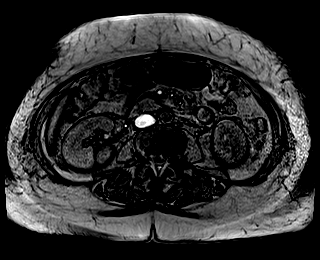
[im 72/72]
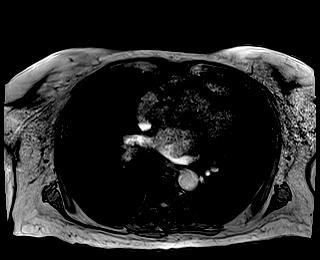

[Series 6: in & out · axial · 3.0mm · 1.19mm/px · z∈[-45,+168]mm · 2 of 72 slices shown (2 of 2)]
[im 1/72]
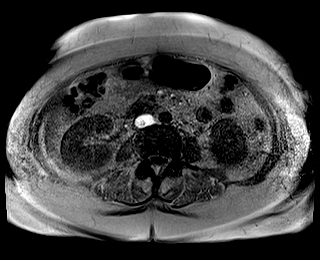
[im 72/72]
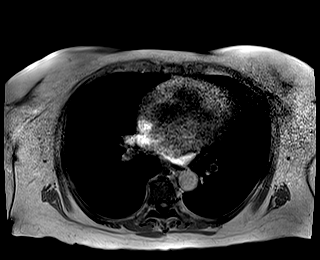

[Series 9: T2 fat-sat · axial · 6.0mm · 1.19mm/px · 1 of 30 slices shown]
[im 1/30]
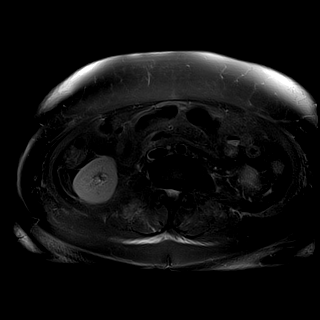

[Series 10: ax dwi_tracew · axial · 6.0mm · 1.42mm/px · z∈[-39,+170]mm · 4 of 90 slices shown]
[im 1/90]
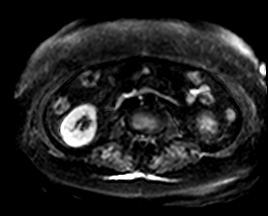
[im 30/90]
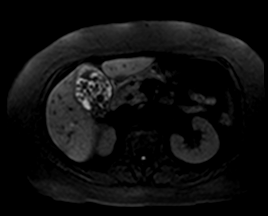
[im 60/90]
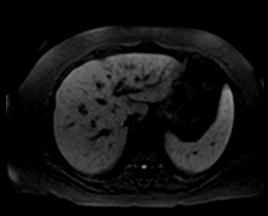
[im 90/90]
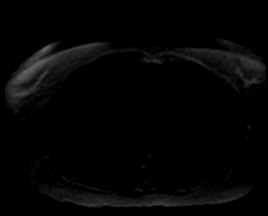

[Series 11: ax dwi_adc · axial · 6.0mm · 1.42mm/px · 1 of 30 slices shown]
[im 1/30]
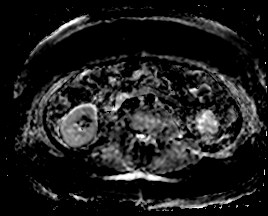

[Series 15: MRCP · coronal · 3.0mm · 1.12mm/px · 1 of 17 slices shown (1 of 2)]
[im 1/17]
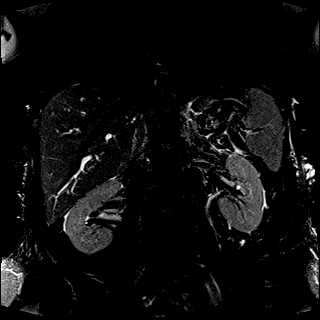

[Series 16: radials · coronal · 50.0mm · 0.78mm/px · 1 of 5 slices shown]
[im 1/5]
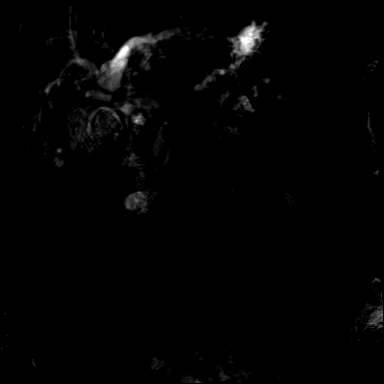

[Series 17: T1 dynamic fat-sat · axial · non-contrast · 3.0mm · 1.19mm/px · z∈[-45,+168]mm · 3 of 72 slices shown (1 of 5)]
[im 1/72]
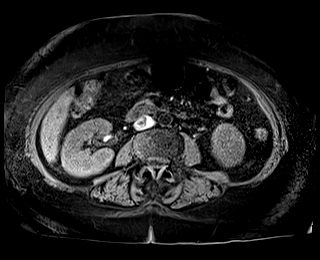
[im 36/72]
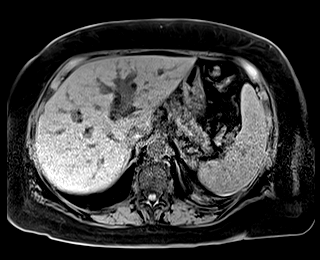
[im 72/72]
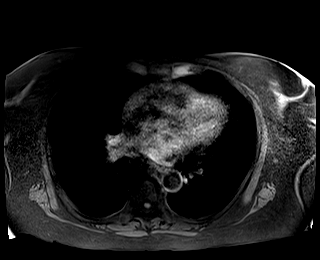

[Series 18: T1 dynamic fat-sat post-contrast · axial · 3.0mm · 1.19mm/px · z∈[-45,+168]mm · 3 of 72 slices shown (1 of 4)]
[im 1/72]
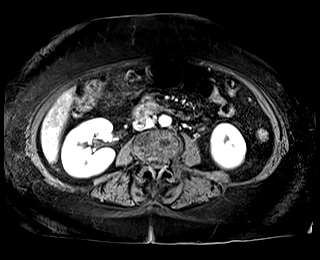
[im 36/72]
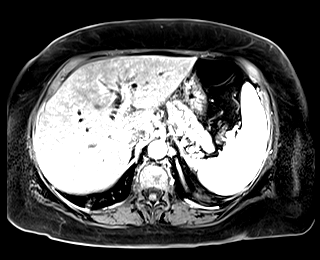
[im 72/72]
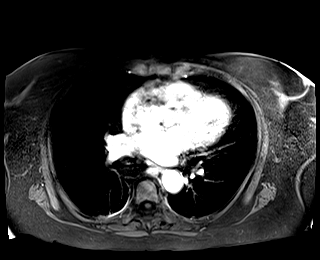

[Series 19: T1 dynamic fat-sat · axial · 3.0mm · 1.19mm/px · z∈[-45,+168]mm · 3 of 72 slices shown (2 of 5)]
[im 1/72]
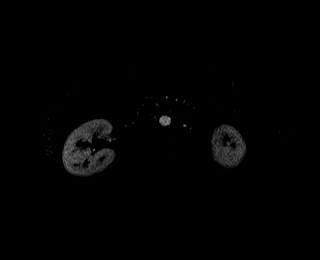
[im 36/72]
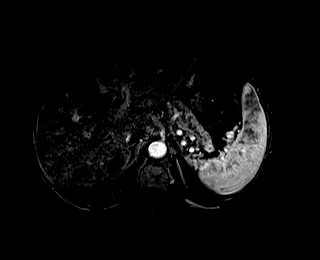
[im 72/72]
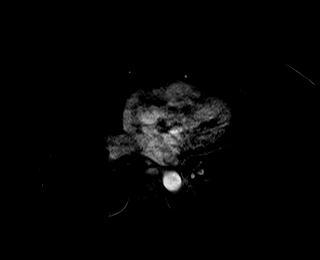

[Series 20: T1 dynamic fat-sat post-contrast · axial · 3.0mm · 1.19mm/px · z∈[-45,+168]mm · 3 of 72 slices shown (2 of 4)]
[im 1/72]
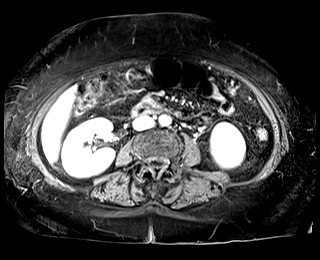
[im 36/72]
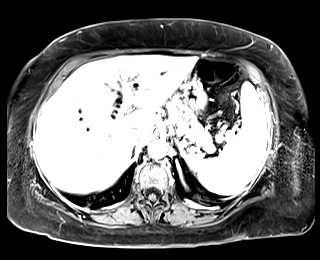
[im 72/72]
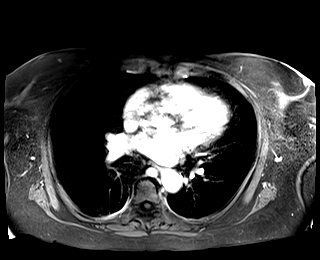

[Series 21: T1 dynamic fat-sat · axial · 3.0mm · 1.19mm/px · z∈[-45,+168]mm · 3 of 72 slices shown (3 of 5)]
[im 1/72]
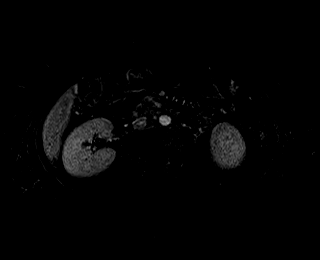
[im 36/72]
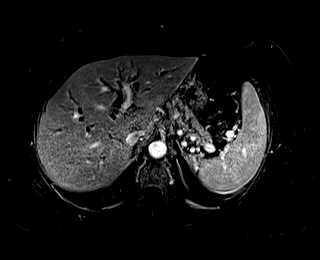
[im 72/72]
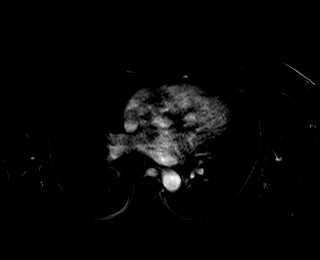

[Series 22: T1 dynamic fat-sat post-contrast · axial · 3.0mm · 1.19mm/px · z∈[-45,+168]mm · 3 of 72 slices shown (3 of 4)]
[im 1/72]
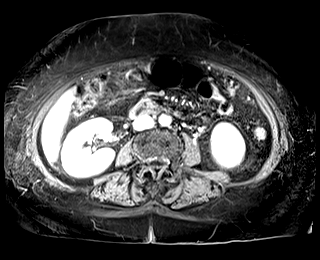
[im 36/72]
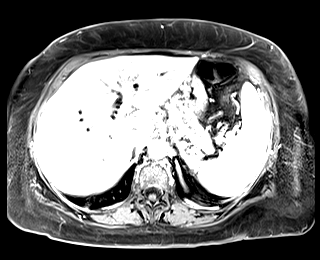
[im 72/72]
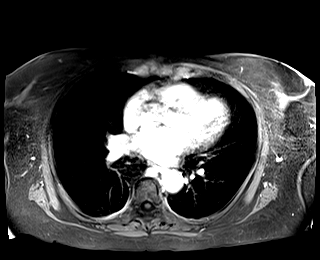

[Series 23: T1 dynamic fat-sat · axial · 3.0mm · 1.19mm/px · z∈[-45,+168]mm · 3 of 72 slices shown (4 of 5)]
[im 1/72]
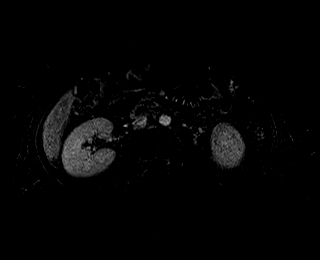
[im 36/72]
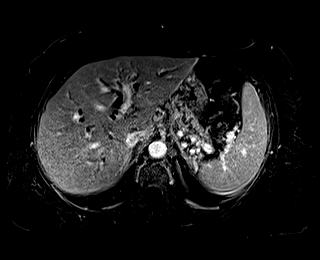
[im 72/72]
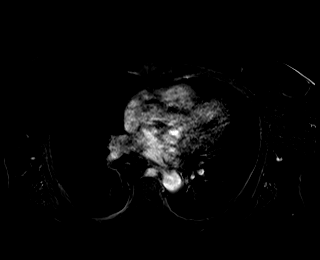

[Series 24: T1 dynamic post-contrast · coronal · 3.0mm · 1.31mm/px · 3 of 72 slices shown]
[im 1/72]
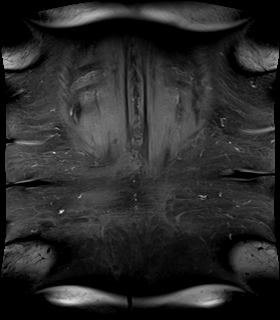
[im 36/72]
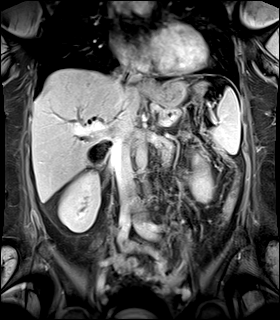
[im 72/72]
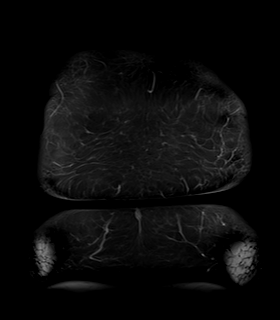

[Series 25: T1 dynamic fat-sat post-contrast · axial · 3.0mm · 1.19mm/px · z∈[-45,+168]mm · 3 of 72 slices shown (4 of 4)]
[im 1/72]
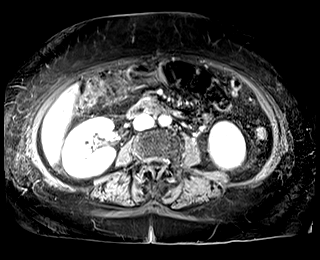
[im 36/72]
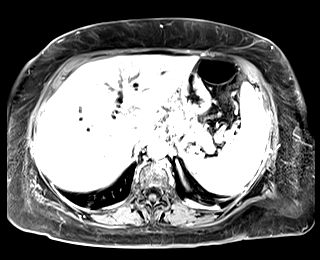
[im 72/72]
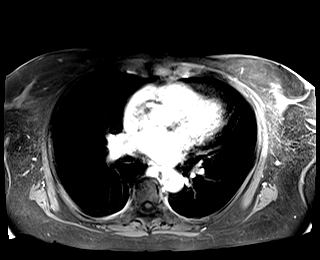

[Series 26: T1 dynamic fat-sat · axial · 3.0mm · 1.19mm/px · z∈[-45,+168]mm · 3 of 72 slices shown (5 of 5)]
[im 1/72]
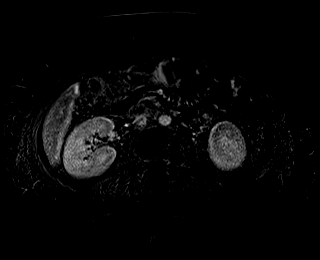
[im 36/72]
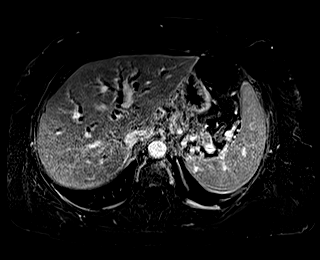
[im 72/72]
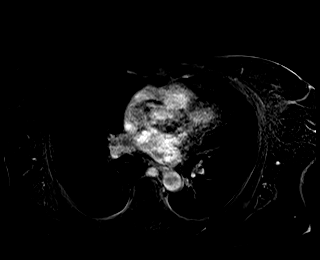

[Series 1022: MRCP · coronal · 0.6mm · 0.43mm/px · 1 of 8 slices shown (2 of 2)]
[im 1/8]
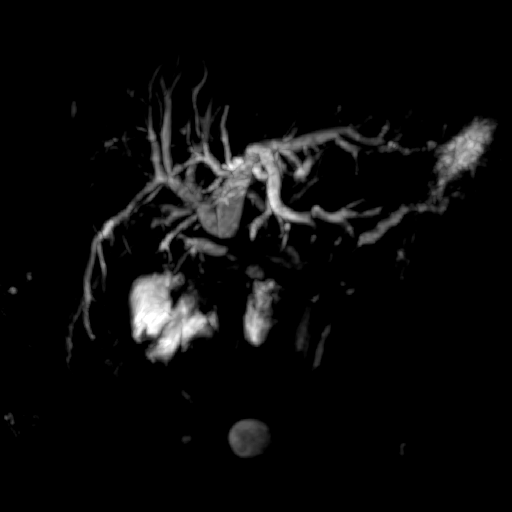

[45 of 48 positions shown; findings below may reference images not displayed]

FINDINGS: Lower chest: No effusion, no consolidative process. Limited
assessment of the lung bases on MRI.

Hepatobiliary: Marked intrahepatic biliary duct distension with
similar appearance to previous imaging. Biliary stent has been
placed since the previous exam traversing the area near the neck of
the pancreas where there is ductal narrowing. Signs of pneumobilia
post stent placement. No visible intrahepatic lesion.

Associated with the superior wall the gallbladder is an area of
intermediate T2 signal that also contacts the RIGHT hepatic artery
and narrows the bile duct (image [DATE]). This measures 16 x 11 mm.
This shows enhancement. This corresponds to the enhancing area seen
on the previous CT in this location. There is some debris in the
distal common bile duct in the dependent aspect.

Portal vein is patent.

Irregular appearance of the gallbladder with numerous gallstones in
the

Cystic duct confluence with common hepatic duct appears to be
involved at the more peripheral area of narrowing.

Pancreas: Focal pancreatic duct dilation beginning just peripheral
to the pancreatic neck (image 43/20) confluent hypointense tissue
above the uncinate process posterior to the portal vein showing low
level enhancement as well is posterior to the site of pancreatic
narrowingaw. There is focal T1 hypointensity in the region of the
neck of the pancreas though this is quite subtle. Perhaps measuring
up to 1.6 x 1.3 cm (image 53/20) corresponding to T1 hypointensity
seen on series 17 in this location.

Posterior to the pancreas and splenic portal confluence is a 2.2 x
1.6 cm area of poorly enhancing tissue suspected to represent either
direct extension or celiac nodal metastasis with indistinct margins
and associated with the site of biliary duct obstruction along the
LEFT lateral margin of the common bile duct open image 45/22)

Spleen:  Unremarkable.

Adrenals/Urinary Tract:  No acute renal findings.

Stomach/Bowel: Unremarkable to the extent evaluated on abdominal
MRI.

Vascular/Lymphatic: Distortion of the portal vein at the splenic
portal confluence. Soft tissue about the common hepatic artery,
RIGHT hepatic artery and the distal celiac trunk. Suspected nodal
metastasis versus is direct extension of tumor near the celiac with
potential periportal node adjacent to the gallbladder as described.

Other:  No ascites.

Musculoskeletal: No suspicious bone lesions identified.
IMPRESSION: Irregular appearance of the gallbladder with septated changes and
cholelithiasis potentially related to acute gangrenous process
superimposed on chronic cholecystitis in the setting of ductal
obstruction. No substantial change from recent imaging.

Two discrete areas of narrowing in the extrahepatic biliary tree,
just above the cystic duct confluence and at the level of the cystic
duct confluence secondary to poorly enhancing areas of tumor and or
nodal disease.

Pancreatic duct obstruction adjacent to site of biliary obstruction
with confluent soft tissue near the celiac suspicious for pancreatic
primary. This is favored though unusual presentation for biliary
primary is considered based on poorly enhancing nodularity near the
gallbladder.

Signs of potential vascular involvement as outlined on previous
imaging. Pancreatic protocol CT could be helpful. Ultimately EUS may
be required for diagnosis.

Post biliary stent placement with persistent marked biliary duct
distension. Perhaps stent does not traverse both areas of ductal
narrowing. There is however pneumobilia within the intrahepatic bile
ducts. The stent is poorly evaluated on the current study. Correlate
with ERCP results and intraprocedural data.

These results will be called to the ordering clinician or
representative by the Radiologist Assistant, and communication
documented in the PACS or [REDACTED].
